# Patient Record
Sex: Female | Born: 1935 | Race: White | Hispanic: No | Marital: Married
Health system: Midwestern US, Academic
[De-identification: ages and names within clinical notes are randomized; demographics above are authoritative.]

---

## 2015-12-14 IMAGING — CR CHEST
2 series · 2 of 2 positions shown · non-contrast
Comparison: none

[chest pa]
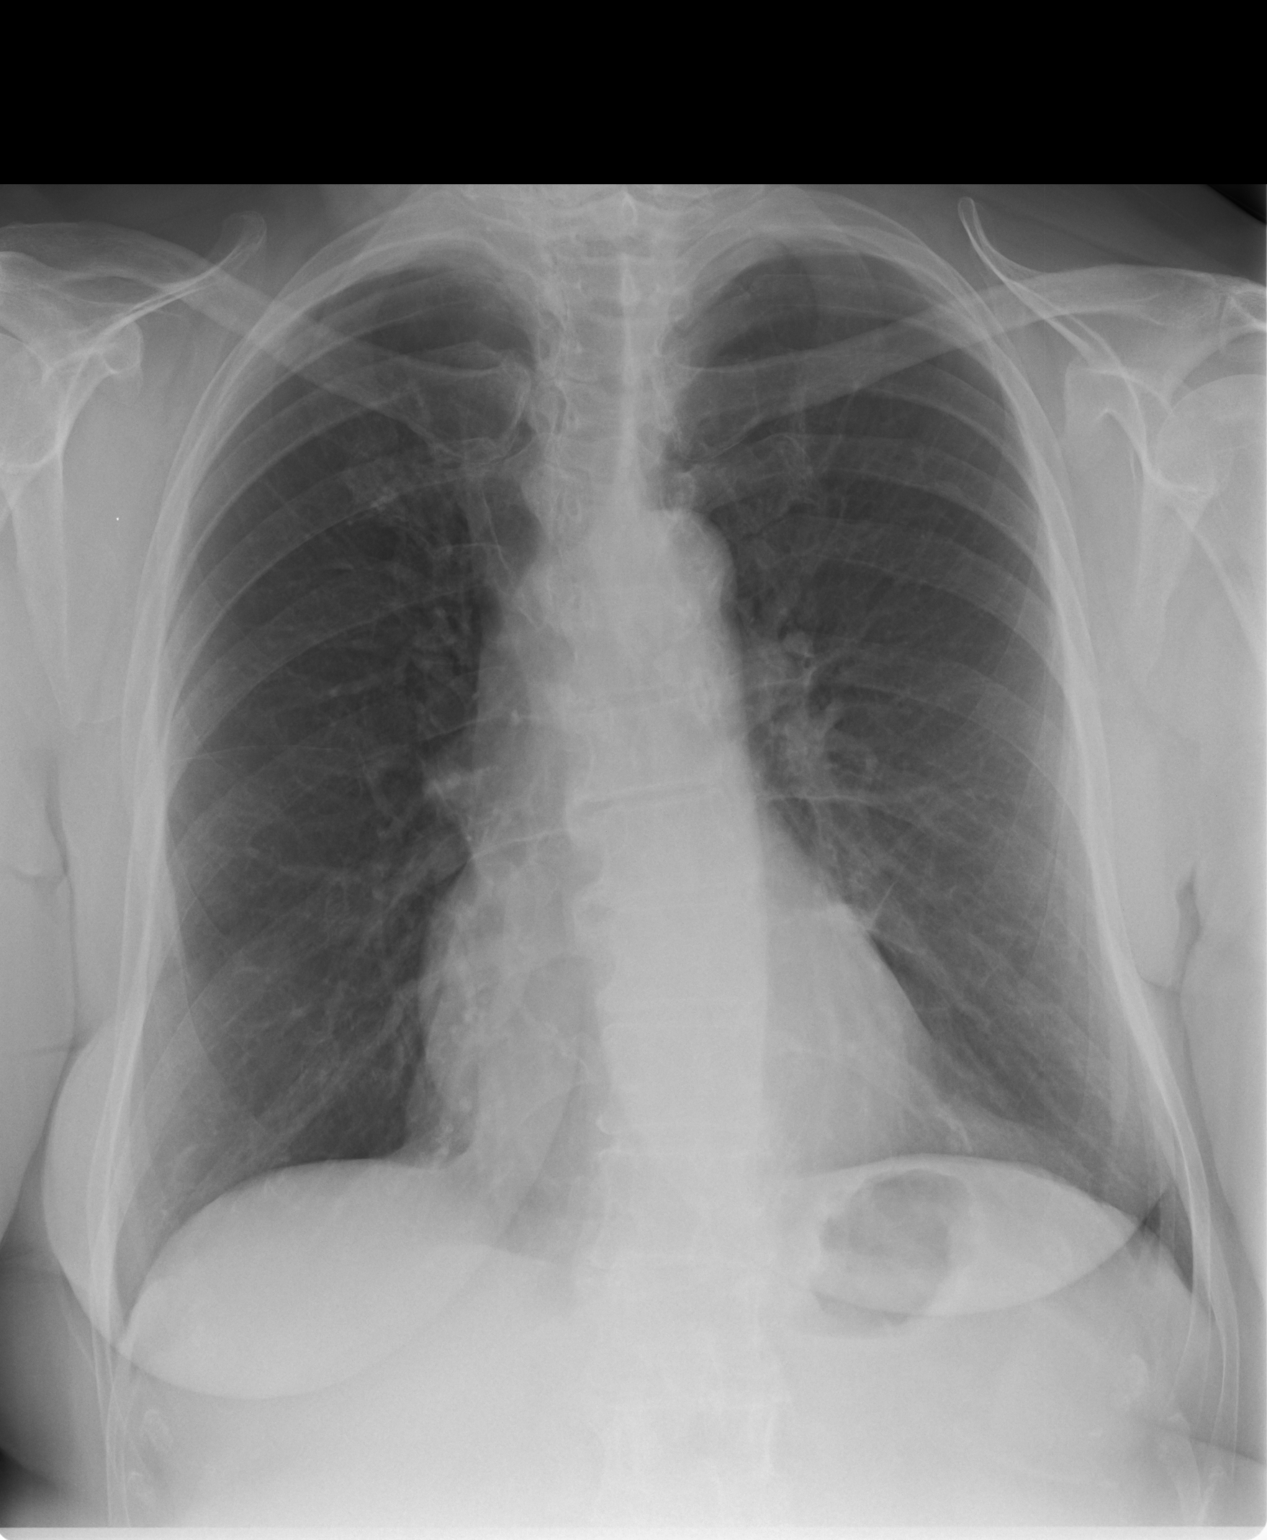

[chest lat]
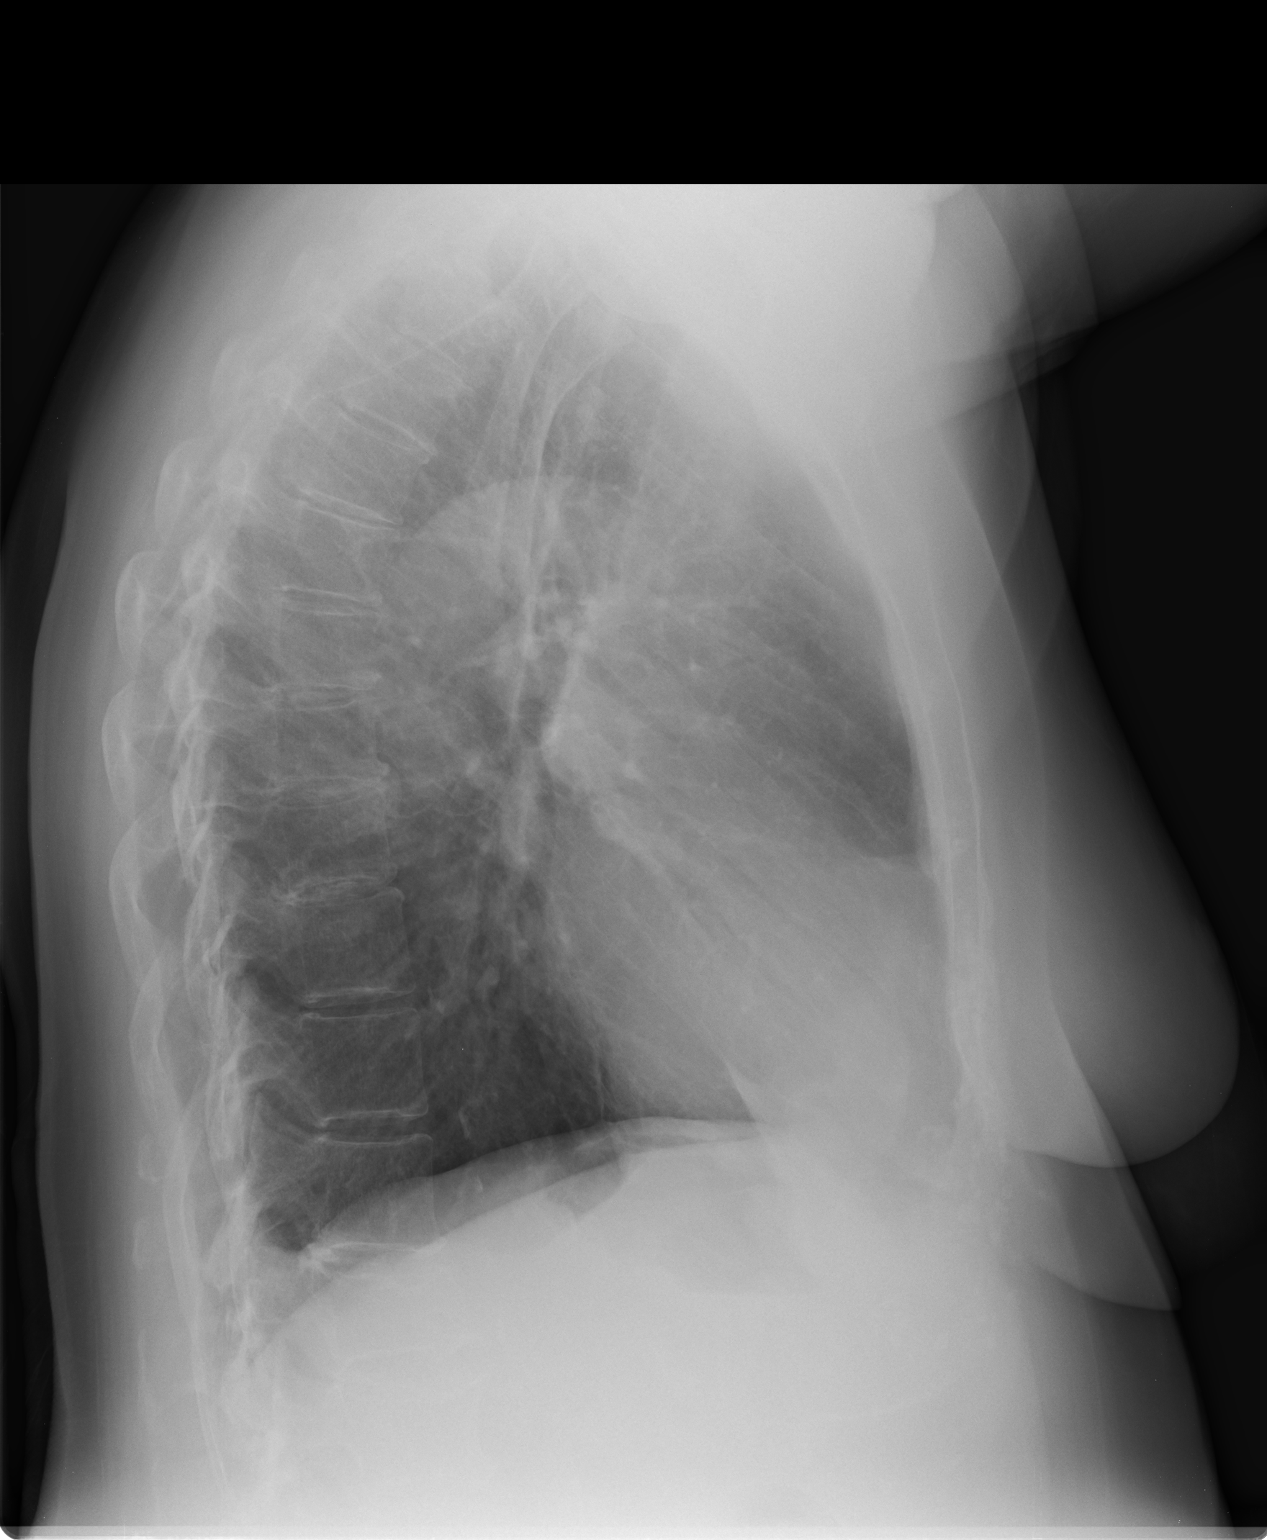

[2 of 2 positions shown; findings below may reference images not displayed]

DIAGNOSTIC STUDIES

EXAM
RADIOLOGICAL EXAMINATION, CHEST; 2 VIEWS FRONTAL AND LATERAL CPT 88686

INDICATION
SOA
SOA. HIP REPLACED 1 WEEK AGO. AB

TECHNIQUE
2 views of the chest were acquired.

COMPARISON
July 30, 2009 chest radiographs

FINDINGS
The cardiac silhouette is within normal limits for size. The mediastinum is not widened or
deviated. The lungs are clear and the costophrenic sulci are sharp. Mild multilevel degenerative
changes in the thoracic spine are present.

IMPRESSION
No acute cardiopulmonary process.

## 2017-09-12 ENCOUNTER — Encounter: Admit: 2017-09-12 | Discharge: 2017-09-12 | Payer: MEDICARE

## 2017-09-12 MED ORDER — ROSUVASTATIN 20 MG PO TAB
20 mg | ORAL_TABLET | Freq: Every day | ORAL | 0 refills | Status: SS
Start: 2017-09-12 — End: 2018-10-14

## 2018-04-02 ENCOUNTER — Ambulatory Visit: Admit: 2018-04-02 | Discharge: 2018-04-03 | Payer: MEDICARE

## 2018-04-03 DIAGNOSIS — I10 Essential (primary) hypertension: Principal | ICD-10-CM

## 2018-04-03 DIAGNOSIS — E782 Mixed hyperlipidemia: ICD-10-CM

## 2018-10-09 IMAGING — CR CHEST
3 series · 3 of 3 positions shown · non-contrast
Comparison: none

[rib upper]
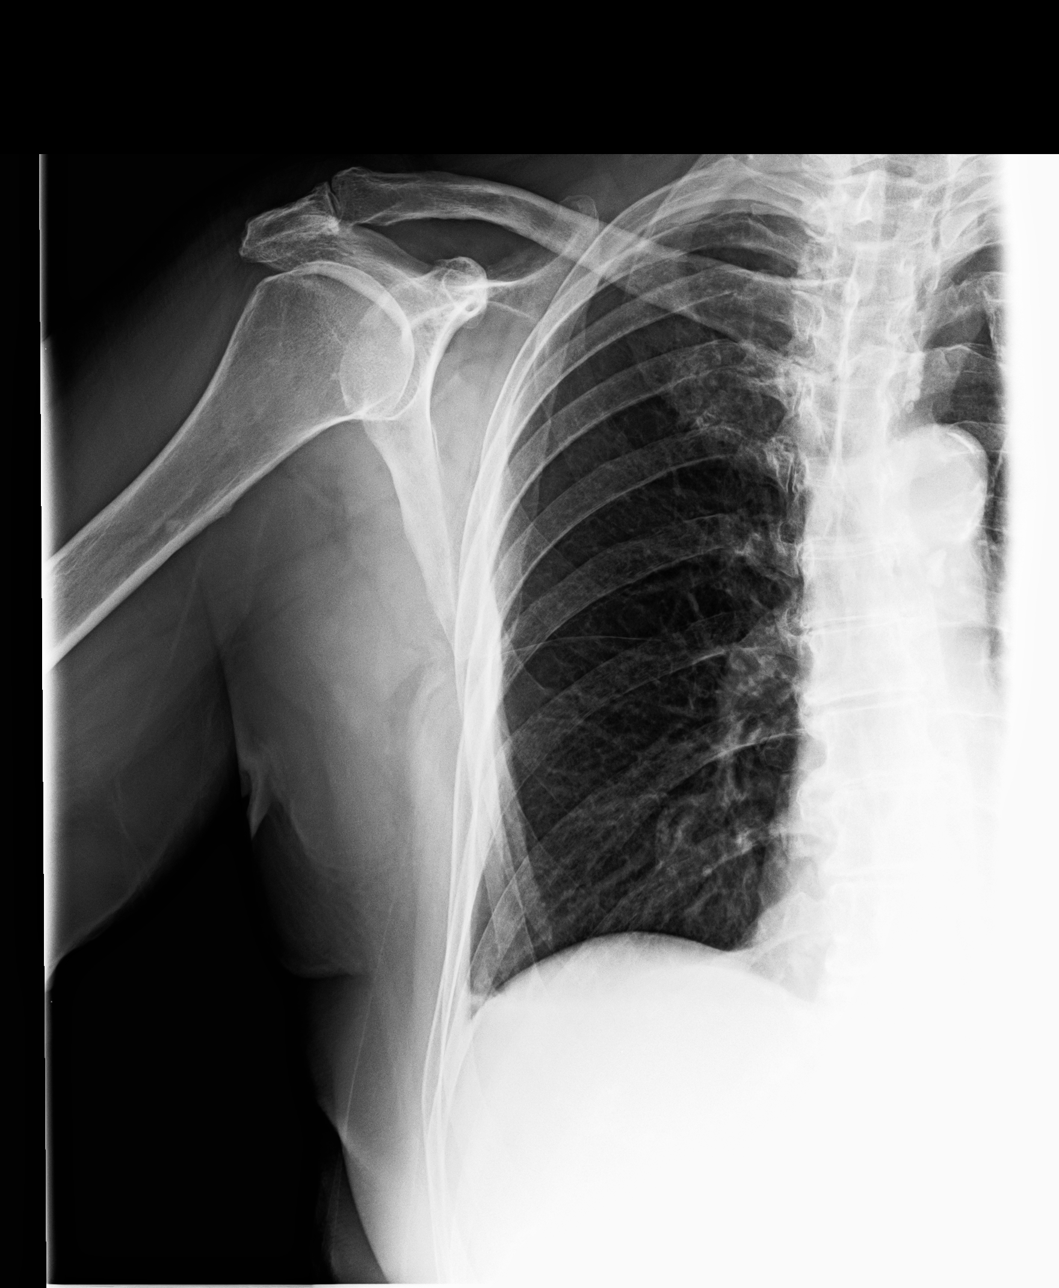

[rib upper obl]
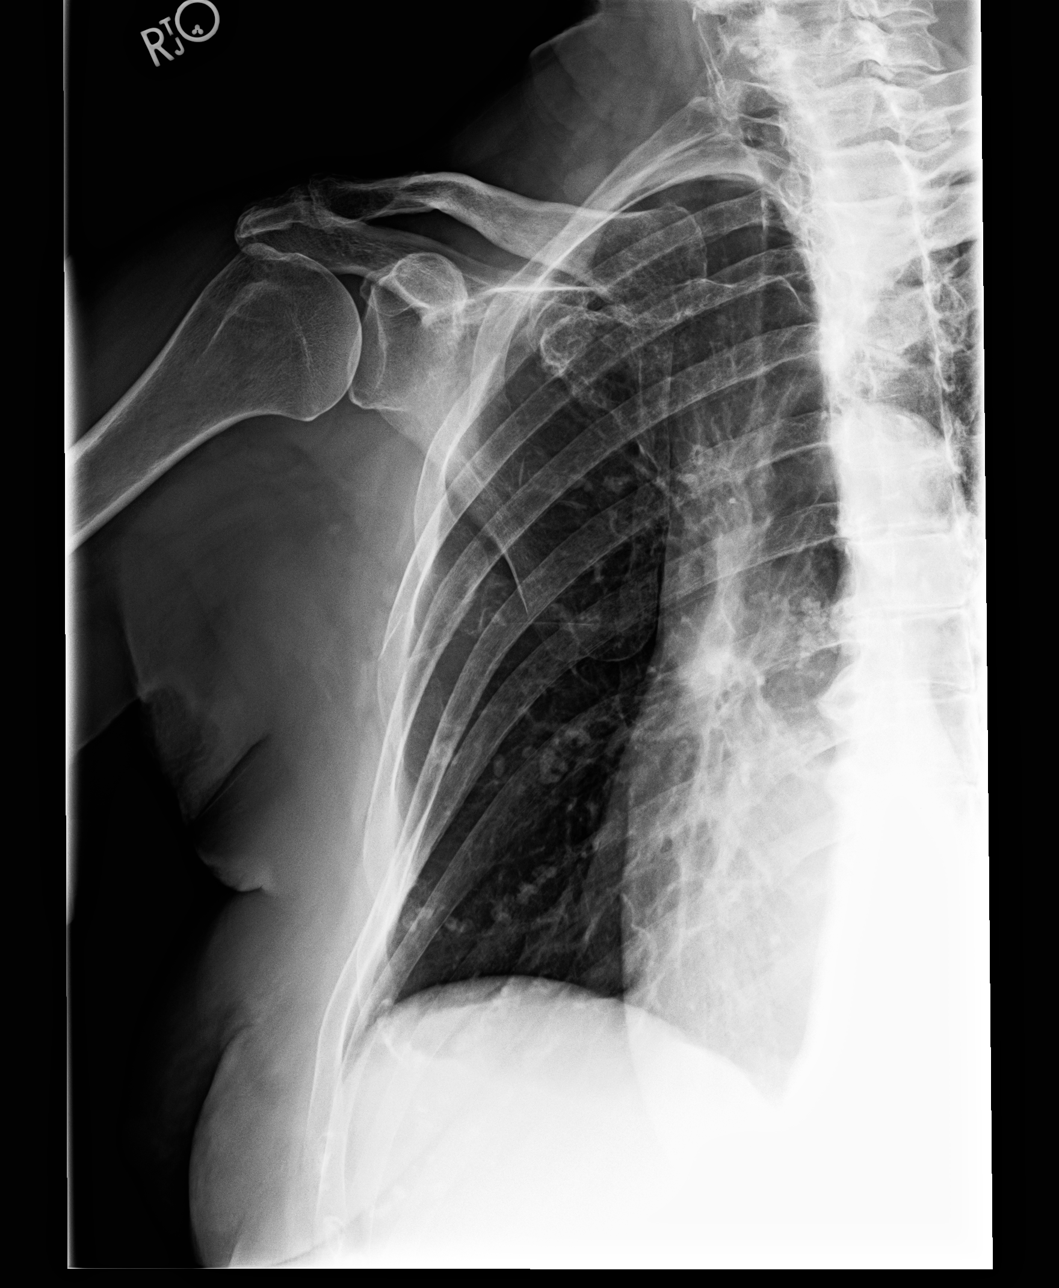

[rib lower]
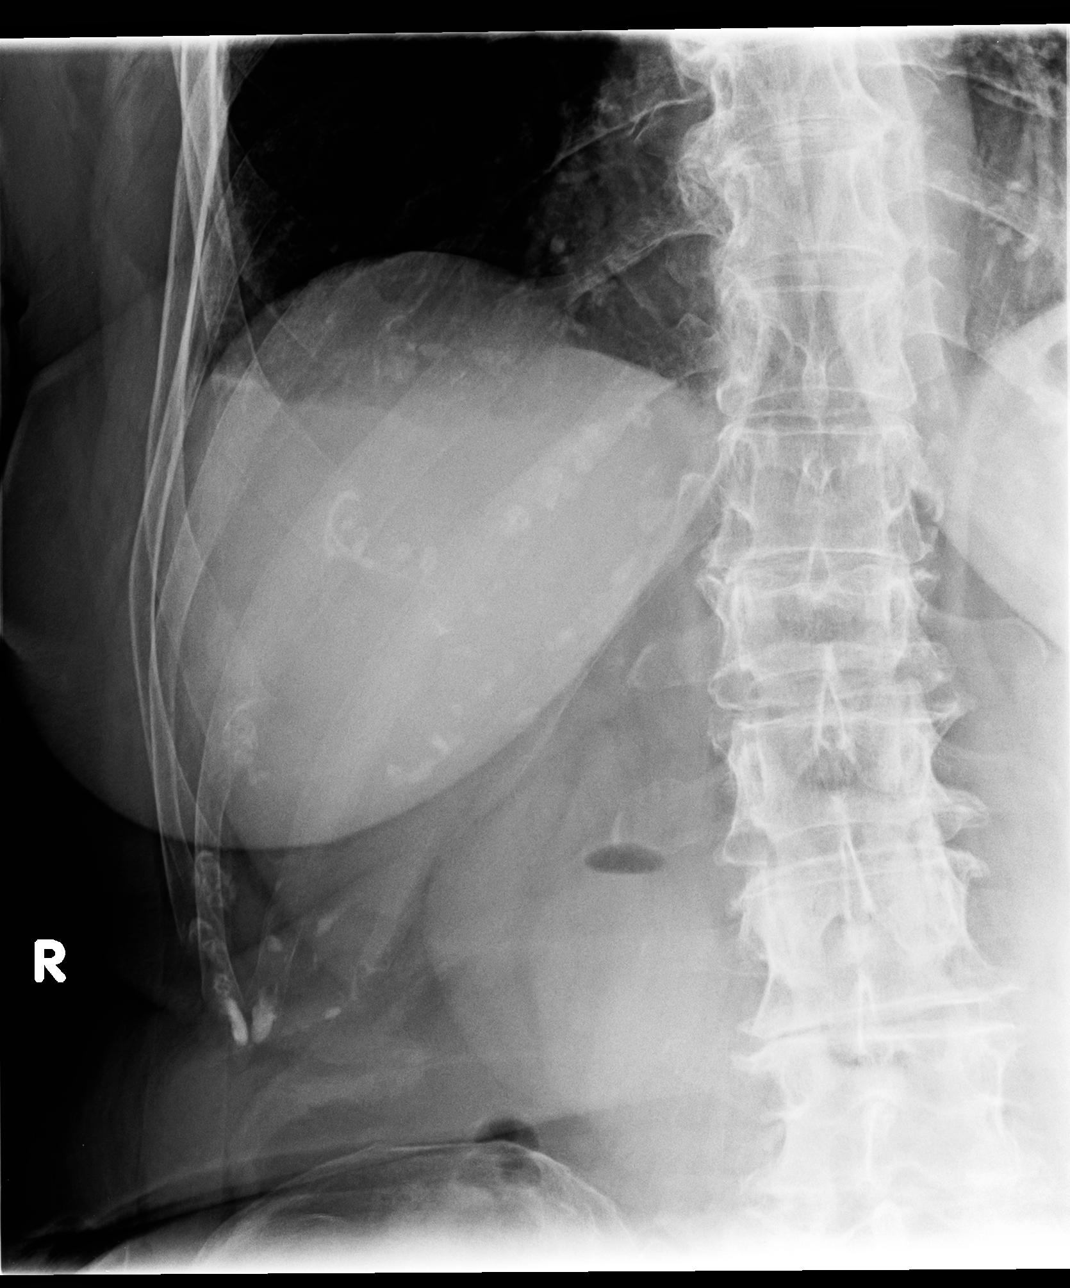

[3 of 3 positions shown; findings below may reference images not displayed]

DIAGNOSTIC STUDIES

EXAM
Right rib series.

INDICATION
right rib pain after fall
PT C/O FALL ONTO RIGHT POSTERIOR SHOULDER WHILE CAMPING. PAIN IN RIGHT LATERAL RIB REGION. TJ/CK

TECHNIQUE
Three views of the right hemithorax.

COMPARISONS
None.

FINDINGS
There is mild to moderate acromioclavicular joint degeneration. Bone demineralization. Evidence of
old granulomatous infection. Aortic atherosclerosis. No evidence of pneumothorax or layering right
pleural fluid. Probable right basilar atelectasis or scarring. No displaced fracture.

IMPRESSION
No displaced fracture.

Tech Notes:

PT C/O FALL ONTO RIGHT POSTERIOR SHOULDER WHILE CAMPING. PAIN IN RIGHT LATERAL RIB REGION. TJ/CK

## 2018-10-09 IMAGING — CR CHEST
3 series · 3 of 3 positions shown · non-contrast
Comparison: none

[shoulder external]
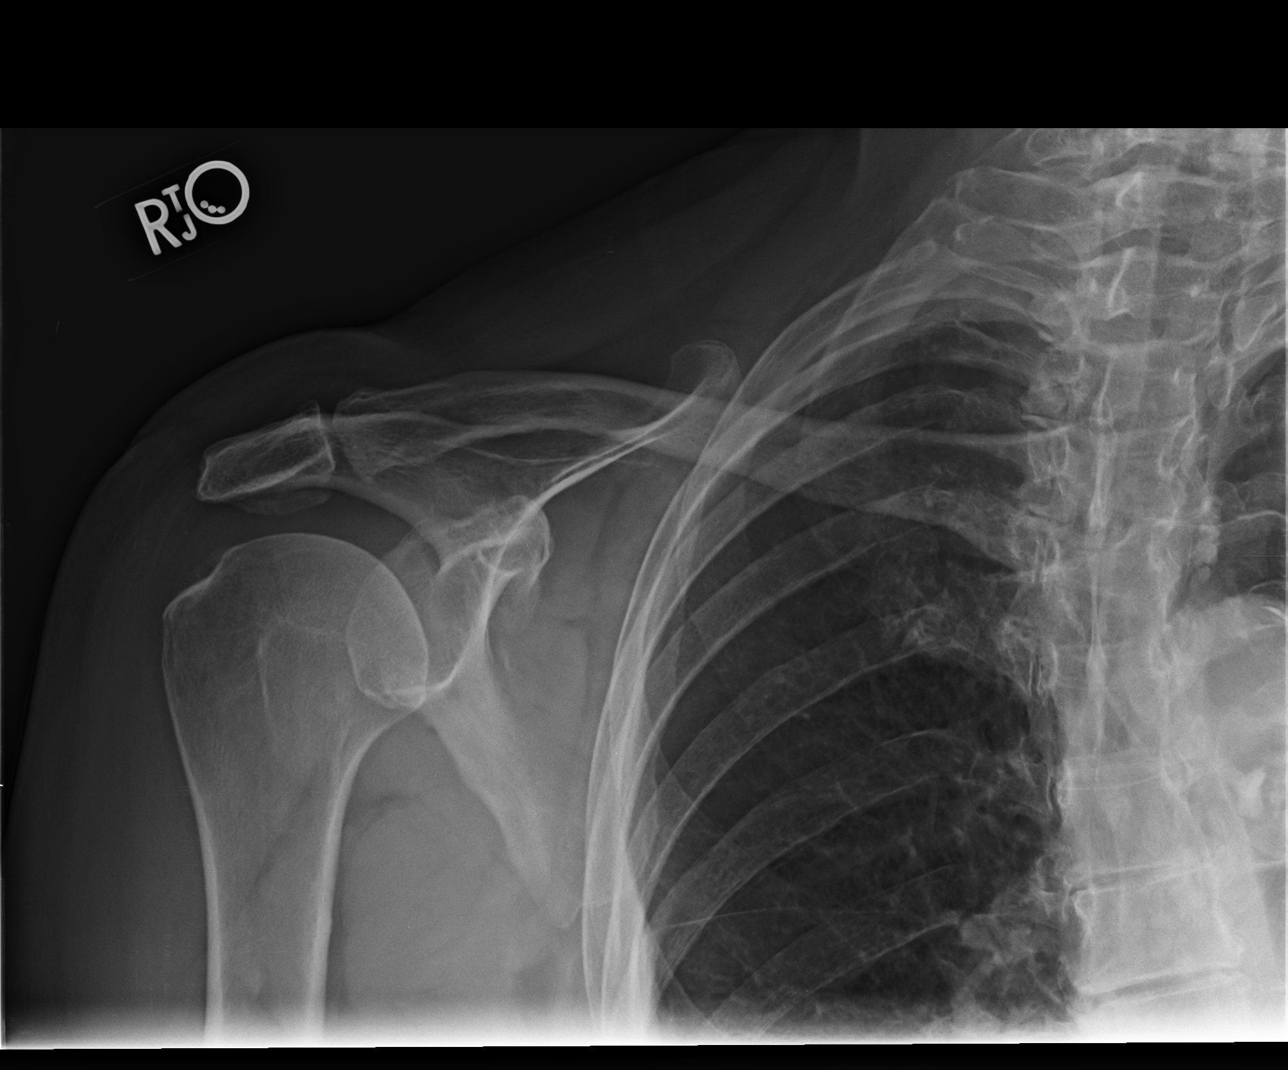

[shoulder internal]
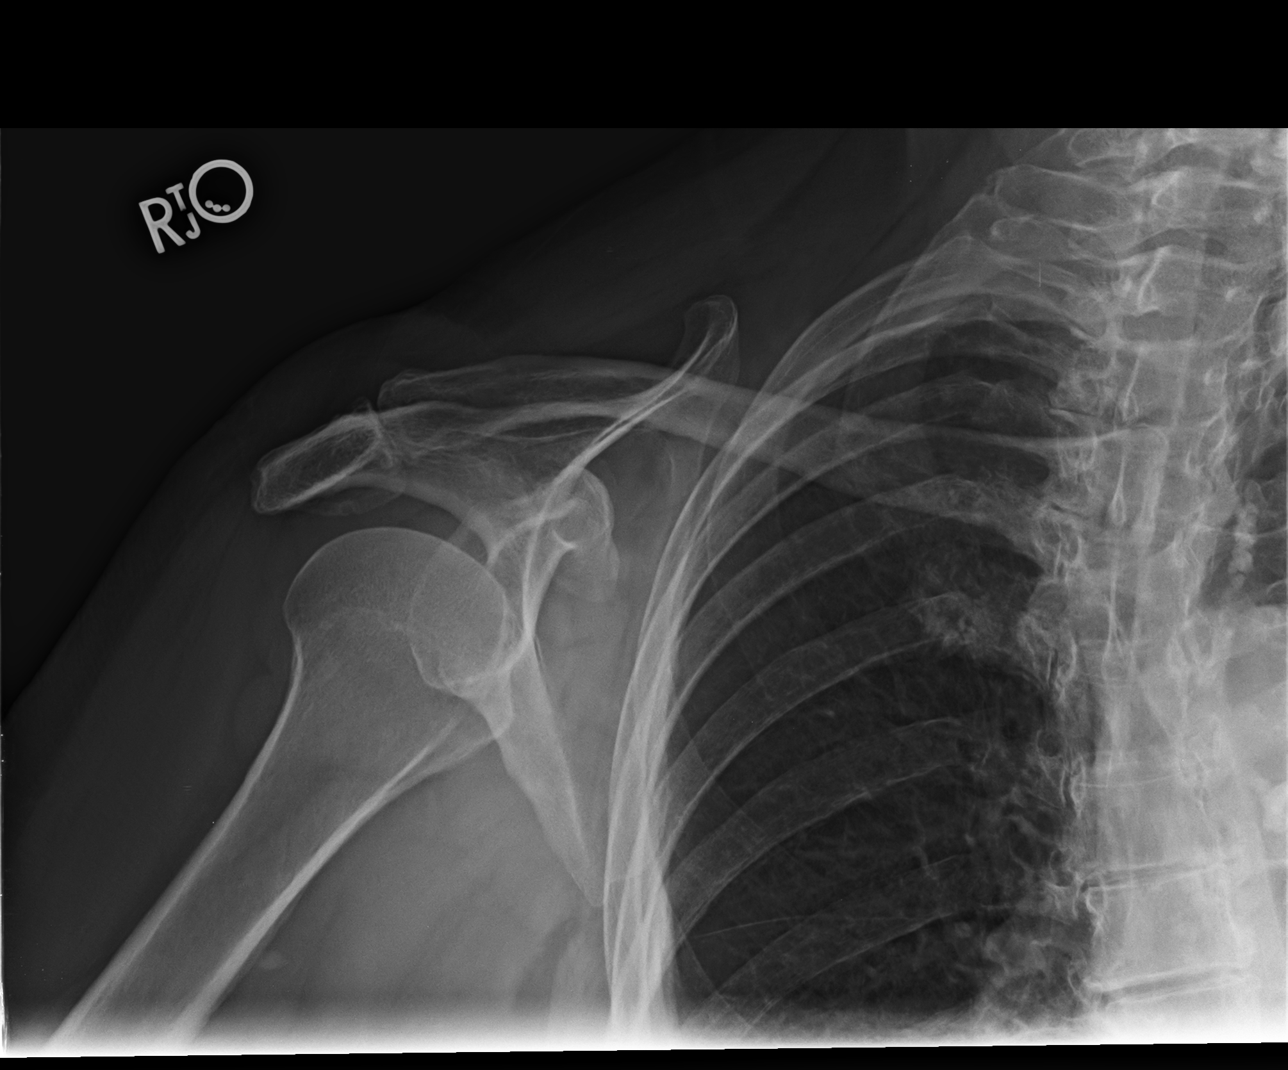

[shoulder y-view]
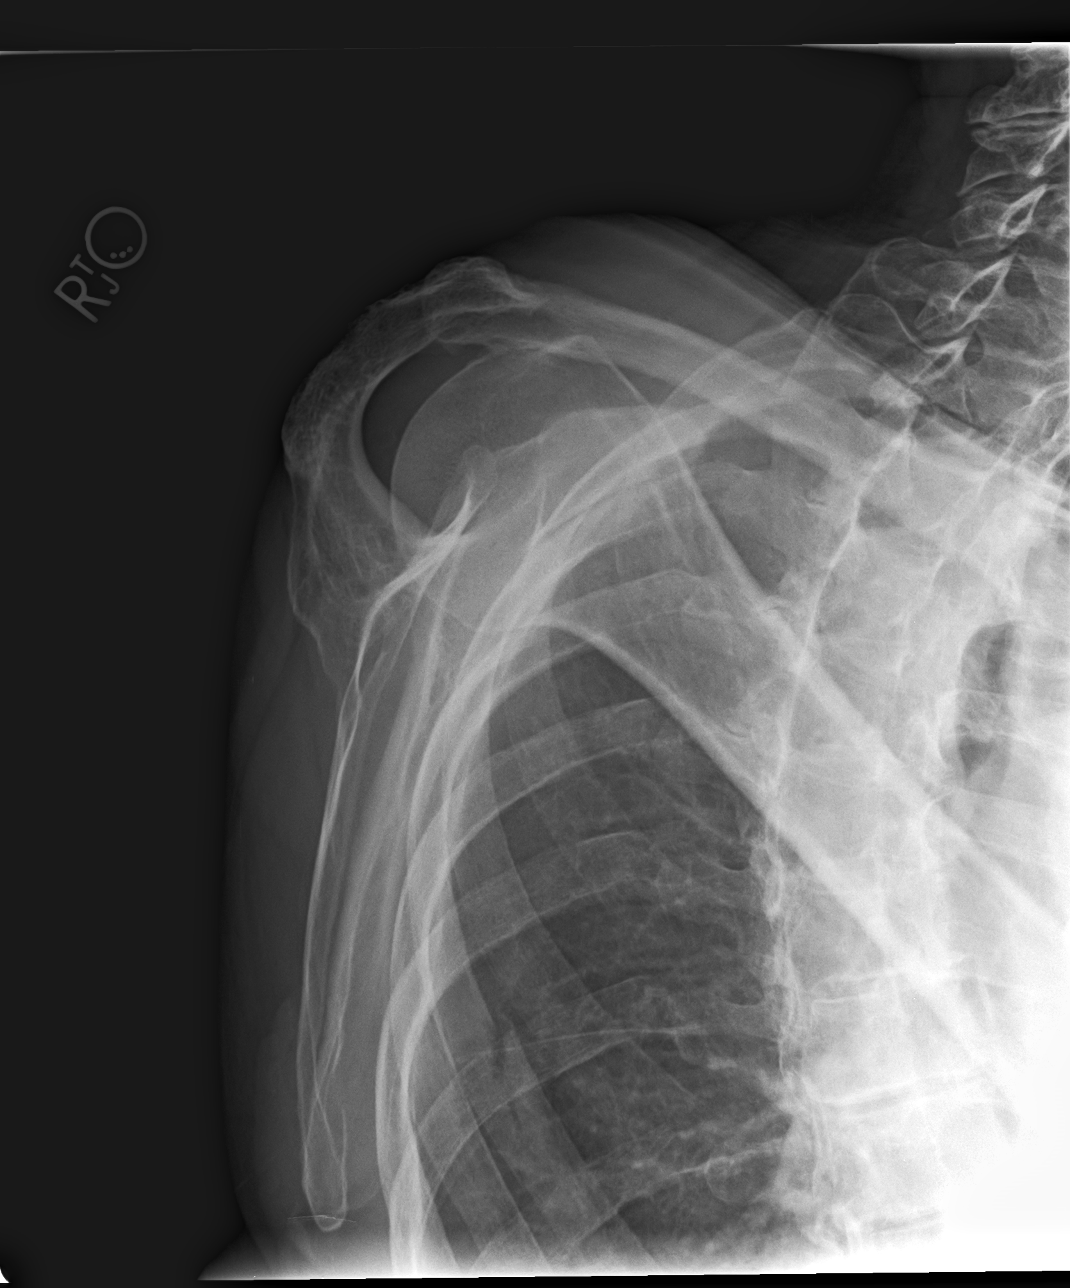

[3 of 3 positions shown; findings below may reference images not displayed]

DIAGNOSTIC STUDIES

EXAM
Right shoulder radiographs.

INDICATION
shoulder injury after fall
PT C/O FALL ONTO RIGHT POSTERIOR SHOULDER WHILE CAMPING. PAIN IN RIGHT LATERAL RIB REGION. TJ/CK

TECHNIQUE
Three views of the right shoulder.

COMPARISONS
None.

FINDINGS
Mild to moderate acromioclavicular osteoarthropathy with undersurface spurring suggested. There is
calcification at the greater tuberosity which can be seen with calcific tendinitis or possible f
ragmented enthesopathy. No acutely displaced shoulder fracture. However, there is a fracture of the
right lateral 5th rib. No aggressive osseous lesion.

IMPRESSION
No shoulder fracture. However, there is a nondisplaced fracture of the right lateral 5th rib.

Tech Notes:

PT C/O FALL ONTO RIGHT POSTERIOR SHOULDER WHILE CAMPING. PAIN IN RIGHT LATERAL RIB REGION. TJ/CK

## 2018-10-12 ENCOUNTER — Encounter: Admit: 2018-10-12 | Discharge: 2018-10-12 | Payer: MEDICARE

## 2018-10-12 ENCOUNTER — Inpatient Hospital Stay: Admit: 2018-10-13 | Discharge: 2018-10-13 | Payer: MEDICARE

## 2018-10-12 DIAGNOSIS — I499 Cardiac arrhythmia, unspecified: ICD-10-CM

## 2018-10-12 DIAGNOSIS — K921 Melena: Principal | ICD-10-CM

## 2018-10-12 DIAGNOSIS — M1990 Unspecified osteoarthritis, unspecified site: ICD-10-CM

## 2018-10-12 DIAGNOSIS — R002 Palpitations: ICD-10-CM

## 2018-10-12 DIAGNOSIS — E785 Hyperlipidemia, unspecified: ICD-10-CM

## 2018-10-12 DIAGNOSIS — M8580 Other specified disorders of bone density and structure, unspecified site: ICD-10-CM

## 2018-10-12 DIAGNOSIS — I10 Essential (primary) hypertension: Principal | ICD-10-CM

## 2018-10-12 DIAGNOSIS — E669 Obesity, unspecified: ICD-10-CM

## 2018-10-12 LAB — URINALYSIS, MICROSCOPIC

## 2018-10-12 LAB — URINALYSIS DIPSTICK
Lab: 6 mg/dL (ref 5.0–8.0)
Lab: NEGATIVE 10*3/uL (ref 0–0.45)
Lab: NEGATIVE MMOL/L (ref 21–30)
Lab: NEGATIVE U/L (ref 7–56)
Lab: NEGATIVE mL/min (ref 0–0.80)
Lab: NEGATIVE mL/min (ref 1.0–4.8)

## 2018-10-12 LAB — LACTIC ACID (BG - RAPID LACTATE): Lab: 1.7 MMOL/L (ref 0.5–2.0)

## 2018-10-12 LAB — CBC AND DIFF
Lab: 0 10*3/uL (ref 0–0.20)
Lab: 8.5 10*3/uL (ref 4.5–11.0)

## 2018-10-12 LAB — COMPREHENSIVE METABOLIC PANEL
Lab: 104 MMOL/L (ref 98–110)
Lab: 113 mg/dL — ABNORMAL HIGH (ref 70–100)
Lab: 138 MMOL/L (ref 137–147)
Lab: 21 mg/dL (ref 7–25)
Lab: 39 U/L (ref 7–40)
Lab: 43 U/L — ABNORMAL LOW (ref 25–110)

## 2018-10-12 LAB — LIPASE: Lab: 40 U/L — ABNORMAL HIGH (ref 11–82)

## 2018-10-12 MED ORDER — BRIMONIDINE 0.2 % OP DROP
1 [drp] | OPHTHALMIC | 0 refills | Status: DC
Start: 2018-10-12 — End: 2018-10-15
  Administered 2018-10-12: 22:00:00 1 [drp] via OPHTHALMIC

## 2018-10-12 MED ORDER — LATANOPROST 0.005 % OP DROP
1 [drp] | Freq: Every evening | OPHTHALMIC | 0 refills | Status: DC
Start: 2018-10-12 — End: 2018-10-15
  Administered 2018-10-13 – 2018-10-14 (×2): 1 [drp] via OPHTHALMIC

## 2018-10-12 MED ORDER — TIMOLOL MALEATE 0.5 % OP DROP
1 [drp] | Freq: Two times a day (BID) | OPHTHALMIC | 0 refills | Status: DC
Start: 2018-10-12 — End: 2018-10-15
  Administered 2018-10-13: 02:00:00 1 [drp] via OPHTHALMIC

## 2018-10-12 MED ORDER — PANTOPRAZOLE 40 MG IV SOLR
80 mg | Freq: Once | INTRAVENOUS | 0 refills | Status: CP
Start: 2018-10-12 — End: ?
  Administered 2018-10-12: 18:00:00 80 mg via INTRAVENOUS

## 2018-10-12 MED ORDER — DORZOLAMIDE 2 % OP DROP
1 [drp] | Freq: Two times a day (BID) | OPHTHALMIC | 0 refills | Status: DC
Start: 2018-10-12 — End: 2018-10-15
  Administered 2018-10-13: 02:00:00 1 [drp] via OPHTHALMIC

## 2018-10-12 MED ORDER — PANTOPRAZOLE 40 MG IV SOLR
40 mg | Freq: Two times a day (BID) | INTRAVENOUS | 0 refills | Status: DC
Start: 2018-10-12 — End: 2018-10-15
  Administered 2018-10-13 – 2018-10-15 (×6): 40 mg via INTRAVENOUS

## 2018-10-12 MED ORDER — LACTATED RINGERS IV SOLP
INTRAVENOUS | 0 refills | Status: CN
Start: 2018-10-12 — End: ?

## 2018-10-13 ENCOUNTER — Inpatient Hospital Stay: Admit: 2018-10-13 | Discharge: 2018-10-13 | Payer: MEDICARE

## 2018-10-13 LAB — HEMOGLOBIN & HEMATOCRIT
Lab: 12 g/dL (ref 12.0–15.0)
Lab: 37 % (ref 36–45)
Lab: 12 g/dL — ABNORMAL LOW (ref 12.0–15.0)

## 2018-10-13 LAB — CBC
Lab: 9.2 K/UL — ABNORMAL LOW (ref >40)
Lab: 13 g/dL (ref 12.0–15.0)
Lab: 14 % (ref 11–15)
Lab: 230 10*3/uL (ref 150–400)
Lab: 31 pg (ref 26–34)
Lab: 33 g/dL (ref 32.0–36.0)
Lab: 39 % (ref 36–45)
Lab: 4.2 M/UL (ref 4.0–5.0)
Lab: 7.5 10*3/uL (ref 4.5–11.0)
Lab: 8.5 FL (ref 7–11)
Lab: 93 FL (ref 80–100)

## 2018-10-13 LAB — COMPREHENSIVE METABOLIC PANEL: Lab: 139 MMOL/L — ABNORMAL HIGH (ref >40)

## 2018-10-13 MED ORDER — LACTATED RINGERS IV SOLP
1000 mL | INTRAVENOUS | 0 refills | Status: CN
Start: 2018-10-13 — End: ?

## 2018-10-14 LAB — COMPREHENSIVE METABOLIC PANEL
Lab: 140 MMOL/L — ABNORMAL HIGH (ref 137–147)
Lab: 3.5 MMOL/L — ABNORMAL HIGH (ref >60)

## 2018-10-14 LAB — CBC: Lab: 7 K/UL — ABNORMAL LOW (ref >60)

## 2018-10-14 MED ORDER — SODIUM CHLORIDE 0.9 % IV SOLP
INTRAVENOUS | 0 refills | Status: CN
Start: 2018-10-14 — End: ?

## 2018-10-15 ENCOUNTER — Encounter: Admit: 2018-10-15 | Discharge: 2018-10-15 | Payer: MEDICARE

## 2018-10-15 ENCOUNTER — Inpatient Hospital Stay: Admit: 2018-10-12 | Discharge: 2018-10-15 | Disposition: A | Discharge status: Home Health | Payer: MEDICARE

## 2018-10-15 DIAGNOSIS — M8580 Other specified disorders of bone density and structure, unspecified site: ICD-10-CM

## 2018-10-15 DIAGNOSIS — I499 Cardiac arrhythmia, unspecified: ICD-10-CM

## 2018-10-15 DIAGNOSIS — K250 Acute gastric ulcer with hemorrhage: Principal | ICD-10-CM

## 2018-10-15 DIAGNOSIS — Z9049 Acquired absence of other specified parts of digestive tract: ICD-10-CM

## 2018-10-15 DIAGNOSIS — K921 Melena: Principal | ICD-10-CM

## 2018-10-15 DIAGNOSIS — Z7982 Long term (current) use of aspirin: ICD-10-CM

## 2018-10-15 DIAGNOSIS — K228 Other specified diseases of esophagus: ICD-10-CM

## 2018-10-15 DIAGNOSIS — I10 Essential (primary) hypertension: ICD-10-CM

## 2018-10-15 DIAGNOSIS — Z800 Family history of malignant neoplasm of digestive organs: ICD-10-CM

## 2018-10-15 DIAGNOSIS — K254 Chronic or unspecified gastric ulcer with hemorrhage: ICD-10-CM

## 2018-10-15 DIAGNOSIS — E785 Hyperlipidemia, unspecified: ICD-10-CM

## 2018-10-15 DIAGNOSIS — K3189 Other diseases of stomach and duodenum: ICD-10-CM

## 2018-10-15 DIAGNOSIS — R002 Palpitations: ICD-10-CM

## 2018-10-15 DIAGNOSIS — Z86010 Personal history of colonic polyps: ICD-10-CM

## 2018-10-15 DIAGNOSIS — S2239XD Fracture of one rib, unspecified side, subsequent encounter for fracture with routine healing: ICD-10-CM

## 2018-10-15 DIAGNOSIS — Z79899 Other long term (current) drug therapy: ICD-10-CM

## 2018-10-15 DIAGNOSIS — E669 Obesity, unspecified: ICD-10-CM

## 2018-10-15 DIAGNOSIS — M1990 Unspecified osteoarthritis, unspecified site: ICD-10-CM

## 2018-10-15 LAB — CBC
Lab: 4.4 M/UL (ref >60)
Lab: 6.7 K/UL — ABNORMAL HIGH (ref 4.5–11.0)

## 2018-10-15 MED ORDER — LACTATED RINGERS IV SOLP
INTRAVENOUS | 0 refills | Status: CN
Start: 2018-10-15 — End: ?

## 2018-10-15 MED ORDER — PROPOFOL 10 MG/ML IV EMUL 20 ML (INFUSION)(AM)(OR)
INTRAVENOUS | 0 refills | Status: DC
Start: 2018-10-15 — End: 2018-10-15
  Administered 2018-10-15: 13:00:00 120 ug/kg/min via INTRAVENOUS

## 2018-10-15 MED ORDER — PANTOPRAZOLE 20 MG PO TBEC
40 mg | ORAL_TABLET | Freq: Two times a day (BID) | ORAL | 3 refills | 90.00000 days | Status: AC
Start: 2018-10-15 — End: 2019-05-16
  Filled 2018-10-15 (×2): qty 360, 7d supply, fill #1
  Filled 2018-10-16 (×2): qty 360, 83d supply, fill #2

## 2018-10-15 MED ORDER — LACTATED RINGERS IV SOLP
1000 mL | Freq: Once | INTRAVENOUS | 0 refills | Status: CP
Start: 2018-10-15 — End: ?
  Administered 2018-10-15: 12:00:00 1000 mL via INTRAVENOUS

## 2018-10-15 MED ORDER — LACTATED RINGERS IV SOLP
0 refills | Status: DC
Start: 2018-10-15 — End: 2018-10-15
  Administered 2018-10-15: 13:00:00 via INTRAVENOUS

## 2018-10-15 MED ORDER — PROPOFOL INJ 10 MG/ML IV VIAL
0 refills | Status: DC
Start: 2018-10-15 — End: 2018-10-15
  Administered 2018-10-15: 13:00:00 40 mg via INTRAVENOUS
  Administered 2018-10-15: 13:00:00 20 mg via INTRAVENOUS

## 2018-10-15 MED ORDER — LIDOCAINE (PF) 200 MG/10 ML (2 %) IJ SYRG
0 refills | Status: DC
Start: 2018-10-15 — End: 2018-10-15
  Administered 2018-10-15: 13:00:00 80 mg via INTRAVENOUS

## 2018-10-16 ENCOUNTER — Encounter: Admit: 2018-10-16 | Discharge: 2018-10-16 | Payer: MEDICARE

## 2018-10-16 DIAGNOSIS — I10 Essential (primary) hypertension: Principal | ICD-10-CM

## 2018-10-16 DIAGNOSIS — E669 Obesity, unspecified: ICD-10-CM

## 2018-10-16 DIAGNOSIS — M1990 Unspecified osteoarthritis, unspecified site: ICD-10-CM

## 2018-10-16 DIAGNOSIS — I499 Cardiac arrhythmia, unspecified: ICD-10-CM

## 2018-10-16 DIAGNOSIS — E785 Hyperlipidemia, unspecified: ICD-10-CM

## 2018-10-16 DIAGNOSIS — M8580 Other specified disorders of bone density and structure, unspecified site: ICD-10-CM

## 2018-10-16 DIAGNOSIS — R002 Palpitations: ICD-10-CM

## 2018-11-15 ENCOUNTER — Encounter: Admit: 2018-11-15 | Discharge: 2018-11-15 | Payer: MEDICARE

## 2019-01-16 ENCOUNTER — Ambulatory Visit: Admit: 2019-01-16 | Discharge: 2019-01-16 | Payer: MEDICARE

## 2019-01-16 ENCOUNTER — Encounter: Admit: 2019-01-16 | Discharge: 2019-01-16 | Payer: MEDICARE

## 2019-01-16 DIAGNOSIS — E785 Hyperlipidemia, unspecified: Secondary | ICD-10-CM

## 2019-01-16 DIAGNOSIS — M8580 Other specified disorders of bone density and structure, unspecified site: Secondary | ICD-10-CM

## 2019-01-16 DIAGNOSIS — I10 Essential (primary) hypertension: Secondary | ICD-10-CM

## 2019-01-16 DIAGNOSIS — M1990 Unspecified osteoarthritis, unspecified site: Secondary | ICD-10-CM

## 2019-01-16 DIAGNOSIS — K250 Acute gastric ulcer with hemorrhage: Principal | ICD-10-CM

## 2019-01-16 DIAGNOSIS — E669 Obesity, unspecified: Secondary | ICD-10-CM

## 2019-01-16 MED ORDER — LACTATED RINGERS IV SOLP
INTRAVENOUS | 0 refills | Status: DC
Start: 2019-01-16 — End: 2019-01-16
  Administered 2019-01-16: 17:00:00 1000.000 mL via INTRAVENOUS
  Administered 2019-01-16: 17:00:00 1000 mL via INTRAVENOUS

## 2019-01-16 MED ORDER — INSULIN ASPART 100 UNIT/ML SC FLEXPEN
6 [IU] | Freq: Once | SUBCUTANEOUS | 0 refills | Status: DC
Start: 2019-01-16 — End: 2019-01-16

## 2019-01-16 MED ORDER — LIDOCAINE (PF) 200 MG/10 ML (2 %) IJ SYRG
0 refills | Status: DC
Start: 2019-01-16 — End: 2019-01-16
  Administered 2019-01-16: 17:00:00 100 mg via INTRAVENOUS

## 2019-01-16 MED ORDER — PROPOFOL 10 MG/ML IV EMUL 50 ML (INFUSION)(AM)(OR)
INTRAVENOUS | 0 refills | Status: DC
Start: 2019-01-16 — End: 2019-01-16
  Administered 2019-01-16: 17:00:00 125 ug/kg/min via INTRAVENOUS

## 2019-01-17 ENCOUNTER — Encounter: Admit: 2019-01-17 | Discharge: 2019-01-17 | Payer: MEDICARE

## 2019-01-17 DIAGNOSIS — I499 Cardiac arrhythmia, unspecified: Secondary | ICD-10-CM

## 2019-01-17 DIAGNOSIS — E669 Obesity, unspecified: Secondary | ICD-10-CM

## 2019-01-17 DIAGNOSIS — R002 Palpitations: Secondary | ICD-10-CM

## 2019-01-17 DIAGNOSIS — I10 Essential (primary) hypertension: Secondary | ICD-10-CM

## 2019-01-17 DIAGNOSIS — M1990 Unspecified osteoarthritis, unspecified site: Secondary | ICD-10-CM

## 2019-01-17 DIAGNOSIS — M8580 Other specified disorders of bone density and structure, unspecified site: Secondary | ICD-10-CM

## 2019-01-17 DIAGNOSIS — E785 Hyperlipidemia, unspecified: Secondary | ICD-10-CM

## 2019-01-21 ENCOUNTER — Encounter: Admit: 2019-01-21 | Discharge: 2019-01-21 | Payer: MEDICARE

## 2019-05-16 ENCOUNTER — Ambulatory Visit: Admit: 2019-05-16 | Discharge: 2019-05-16 | Payer: MEDICARE

## 2019-05-16 ENCOUNTER — Encounter: Admit: 2019-05-16 | Discharge: 2019-05-16 | Payer: MEDICARE

## 2019-05-16 DIAGNOSIS — M1990 Unspecified osteoarthritis, unspecified site: ICD-10-CM

## 2019-05-16 DIAGNOSIS — E782 Mixed hyperlipidemia: ICD-10-CM

## 2019-05-16 DIAGNOSIS — E7849 Other hyperlipidemia: ICD-10-CM

## 2019-05-16 DIAGNOSIS — I499 Cardiac arrhythmia, unspecified: ICD-10-CM

## 2019-05-16 DIAGNOSIS — R002 Palpitations: Secondary | ICD-10-CM

## 2019-05-16 DIAGNOSIS — I10 Essential (primary) hypertension: Principal | ICD-10-CM

## 2019-05-16 DIAGNOSIS — I341 Nonrheumatic mitral (valve) prolapse: Secondary | ICD-10-CM

## 2019-05-16 DIAGNOSIS — E669 Obesity, unspecified: ICD-10-CM

## 2019-05-16 DIAGNOSIS — M8580 Other specified disorders of bone density and structure, unspecified site: ICD-10-CM

## 2019-05-16 DIAGNOSIS — E785 Hyperlipidemia, unspecified: ICD-10-CM

## 2019-05-16 NOTE — Progress Notes
Psychiatric/Behavioral: Negative.    Allergic/Immunologic: Negative.        Physical Exam  Physical Exam   General Appearance: alert and oriented, no acute distress  Skin: warm, moist, no ulcers  Head: normocephalic, symmetric  Eyes: EOMI, PERRL, sclera are clear and without icterus  ENT: unremarkable, nares patent  Neck Veins: neck veins are flat, neck veins are not distended  Carotid Arteries: normal carotid upstroke bilaterally, no bruits  Chest Inspection: chest is normal in appearance  Auscultation/Percussion: lungs clear to auscultation, no rales, rhonchi, wheezes or friction rub appreciated  Cardiac Rhythm: regular rhythm and normal rate  Cardiac Auscultation: Normal S1 & S2, no S3 or S4, no rub - normal pmi  Murmurs: no cardiac murmurs   Extremities: no lower extremity edema bilaterally; 2+ symmetric distal pulses  Muskuloskeletal: no obvious deformity  Neurologic Exam: neurological assessment grossly intact  Mood and Affect: Appropriate      Cardiovascular Studies  ECG - NSR    Problems Addressed Today  Encounter Diagnoses   Name Primary?   ??? Mixed hyperlipidemia Yes   ??? Essential hypertension    ??? Palpitations    ??? Mitral valve prolapse        Assessment and Plan     1. Hypertension-she is currently on losartan and hydrochlorothiazide.  Her blood pressures been well controlled and she is not having any orthostatic symptoms.  I am not can make any adjustments to her antihypertensive regimen.  2. Dyslipidemia-she is due for a repeat fasting lipid profile.  She is currently off a statin due to the elevations in her liver enzymes.  She is can be following back with Dr. like to get her blood work done.    We will plan to see her back on an annual basis or sooner if needed.         Current Medications (including today's revisions)  ??? brimonidine (ALPHAGAN P) 0.15 % ophthalmic solution Apply 1 drop to both eyes three times daily.   ??? calcium carbonate/vitamin D3 (CALCIUM 600 + D PO) Take 2 tablets by

## 2019-05-27 LAB — HEMOGLOBIN A1C: Lab: 5.2

## 2019-05-27 LAB — COMPREHENSIVE METABOLIC PANEL
Lab: 0.4
Lab: 0.8
Lab: 108
Lab: 140
Lab: 17
Lab: 19
Lab: 21
Lab: 24
Lab: 4
Lab: 4.1
Lab: 48
Lab: 7.4
Lab: 95
Lab: >60
Lab: >60

## 2019-05-29 ENCOUNTER — Encounter: Admit: 2019-05-29 | Discharge: 2019-05-29

## 2019-05-29 DIAGNOSIS — I341 Nonrheumatic mitral (valve) prolapse: Secondary | ICD-10-CM

## 2019-05-29 DIAGNOSIS — E782 Mixed hyperlipidemia: Secondary | ICD-10-CM

## 2019-05-29 DIAGNOSIS — I10 Essential (primary) hypertension: Secondary | ICD-10-CM

## 2019-05-29 DIAGNOSIS — R002 Palpitations: Secondary | ICD-10-CM

## 2019-06-13 ENCOUNTER — Encounter: Admit: 2019-06-13 | Discharge: 2019-06-13

## 2020-06-25 ENCOUNTER — Ambulatory Visit: Admit: 2020-06-25 | Discharge: 2020-06-25 | Payer: MEDICARE

## 2020-06-25 ENCOUNTER — Encounter: Admit: 2020-06-25 | Discharge: 2020-06-25 | Payer: MEDICARE

## 2020-06-25 DIAGNOSIS — E782 Mixed hyperlipidemia: Secondary | ICD-10-CM

## 2020-06-25 DIAGNOSIS — M1990 Unspecified osteoarthritis, unspecified site: Secondary | ICD-10-CM

## 2020-06-25 DIAGNOSIS — R002 Palpitations: Secondary | ICD-10-CM

## 2020-06-25 DIAGNOSIS — E669 Obesity, unspecified: Secondary | ICD-10-CM

## 2020-06-25 DIAGNOSIS — M8580 Other specified disorders of bone density and structure, unspecified site: Secondary | ICD-10-CM

## 2020-06-25 DIAGNOSIS — I10 Essential (primary) hypertension: Secondary | ICD-10-CM

## 2020-06-25 DIAGNOSIS — I499 Cardiac arrhythmia, unspecified: Secondary | ICD-10-CM

## 2020-06-25 DIAGNOSIS — E785 Hyperlipidemia, unspecified: Secondary | ICD-10-CM

## 2020-06-25 NOTE — Progress Notes
Date of Service: 06/25/2020    Shari Spence is a 84 y.o. female.       HPI     Shari Spence is an 84 year old female who I am seeing today for routine follow-up.  We have followed her primarily for dyslipidemia as well as blood pressure management.  Over the last 12 months, she has not had any new subjective complaints.  She is continue to be active and denies any new exertional chest discomfort or shortness of breath.  I reviewed her most recent lab work and she is going be due for updated fasting lipid profile as well as comprehensive metabolic profile, but otherwise has no other new subjective cardiovascular complaints.         Vitals:    06/25/20 0917   BP: 136/70   BP Source: Arm, Right Upper   Patient Position: Sitting   Pulse: 50   Weight: 74.5 kg (164 lb 4.8 oz)   Height: 1.727 m (5' 8)   PainSc: Zero     Body mass index is 24.98 kg/m?Marland Kitchen     Past Medical History  Patient Active Problem List    Diagnosis Date Noted   ? Melena 10/12/2018   ? Overweight (BMI 25.0-29.9) 09/02/2011   ? Hyperlipemia 05/07/2009     on statin therapy and LDL is not at goal     ? Palpitations 05/06/2009     been experiencing heart palpitations for more than 30 years     ? HTN (hypertension) 05/06/2009     Borderline hypertension - fairly well controlled only with diuretics     ? Mitral valve prolapse 05/06/2009     Pt had Echo 4/08 demonstrated normal left ventricular systolic function, EF 50%, no valvular abnormalities, no evidence of mitral valve prolapse     ? Arthritis 05/06/2009   ? Osteopenia 04/09/2007         Review of Systems   Constitution: Negative.   HENT: Negative.    Eyes: Negative.    Cardiovascular: Negative.    Respiratory: Negative.    Endocrine: Negative.    Hematologic/Lymphatic: Negative.    Skin: Negative.    Musculoskeletal: Negative.    Gastrointestinal: Negative.    Genitourinary: Negative.    Neurological: Negative.    Psychiatric/Behavioral: Negative.    Allergic/Immunologic: Negative.    All other systems reviewed and are negative.      Physical Exam  Physical Exam  GEN: alert and oriented, no acute distress  HEENT: unremarkable, EOMI, PERRL  CHEST: Clear to auscultation bilaterally without focal rales, wheezes or rhonchi  CV: Reg rhythm, nml rate; nml S1 & S2, no S3 or S4; no rub; no murmurs  ABD: soft, nontender, BS+, no masses or bruits, no organomegaly   EXT: no c/c/e, 2+ distal pulses  NEURO: nonfocal  Mood and Affect: appropriate      Cardiovascular Studies  ECG - NSR    Problems Addressed Today  Encounter Diagnoses   Name Primary?   ? Mixed hyperlipidemia Yes   ? Palpitations    ? Essential hypertension        Assessment and Plan     1. Dyslipidemia-we will plan to repeat a fasting lipid profile as well as comprehensive metabolic profile.  She has been intolerant to statins but is currently on Zetia 10 mg daily.  I would like to target an LDL of less than 100 if possible.  Additionally, she has had an elevated hemoglobin A1c we will obtain a CBC  given that we are doing a blood draw so that her primary care physician can have updated labs from the summer.    Appreciate being involved with her care and will look for the results of her upcoming fasting lipid profile.         Current Medications (including today's revisions)  ? brimonidine (ALPHAGAN P) 0.15 % ophthalmic solution Apply 1 drop to both eyes three times daily.   ? calcium carbonate/vitamin D3 (CALCIUM 600 + D PO) Take 2 tablets by mouth daily.   ? cholecalciferol(+) (VITAMIN D-3) 5,000 unit tablet Take 5,000 Units by mouth daily.   ? CoQ10 (Ubiquinol) 200 mg cap Take 1 capsule by mouth daily.   ? cyanocobalamin (vitamin B-12) (VITAMIN B-12) 5,000 mcg subl Place 1 tablet under tongue daily.   ? Diphenhydramine-Acetaminophen (TYLENOL PM EXTRA STRENGTH) 25-500 mg tab tablet Take 1 tablet by mouth at bedtime daily.   ? docusate (COLACE) 100 mg capsule Take 100 mg by mouth daily.   ? dorzolamide-timolol (PF) 2-0.5 % drop Place 1 drop into or around eye(s) twice daily.   ? ezetimibe (ZETIA) 10 mg tablet Take 10 mg by mouth daily.   ? Folic Acid 800 mcg tab Take 1 tablet by mouth daily.   ? hydroCHLOROthiazide (HYDRODIURIL) 12.5 mg tablet Take 12.5 mg by mouth every morning.   ? hydrocortisone 2.5 % topical cream Apply  topically to affected area twice daily.   ? Lactobacillus acidophilus (FLORAJEN) 460 mg (20 billion cell) cap Take 1 capsule by mouth every morning.   ? latanoprost (XALATAN) 0.005 % ophthalmic solution Place 1 Drop into or around eye(s) at bedtime daily.   ? losartan (COZAAR) 50 mg tablet Take 50 mg by mouth daily.   ? melatonin 10 mg tab Take 1 tablet by mouth at bedtime daily.   ? Omega-3 Fatty Acids-Vitamin E (FISH OIL) 1,000 mg Cap Take 1 capsule by mouth daily.   ? other medication Glucomasine/Chondroitin 1500/200: take 2 tablets by mouth daily   ? Potassium 99 mg tab Take 1 tablet by mouth daily.   ? tacrolimus(+) (PROTOPIC) 0.1 % topical ointment Apply  topically to affected area twice daily.   ? vitamins, multi w/minerals 9 mg iron-400 mcg tab Take 1 tablet by mouth daily.

## 2020-06-30 ENCOUNTER — Encounter: Admit: 2020-06-30 | Discharge: 2020-06-30 | Payer: MEDICARE

## 2020-06-30 DIAGNOSIS — E782 Mixed hyperlipidemia: Secondary | ICD-10-CM

## 2020-06-30 DIAGNOSIS — R2232 Localized swelling, mass and lump, left upper limb: Secondary | ICD-10-CM

## 2020-06-30 MED ORDER — PRAVASTATIN 10 MG PO TAB
10 mg | ORAL_TABLET | Freq: Every evening | ORAL | 1 refills | 90.00000 days | Status: AC
Start: 2020-06-30 — End: ?

## 2020-06-30 NOTE — Telephone Encounter
Reviewed FLP with Dr. Paris Lore. Per Dr. Paris Lore, pt to add Pravastatin 10mg  daily to medication regimen and recheck LFT and FLP in 3 months.    Connected with pt. Informed pt of results and Dr. Oda Kilts recommendations.  Patient verbalized understanding and does not have any questions or concerns.  Will send lab orders in mail in about 3 months. Pt agreeable to plan.

## 2020-08-18 ENCOUNTER — Encounter: Admit: 2020-08-18 | Discharge: 2020-08-18 | Payer: MEDICARE

## 2020-08-18 DIAGNOSIS — R2232 Localized swelling, mass and lump, left upper limb: Secondary | ICD-10-CM

## 2020-08-19 ENCOUNTER — Encounter: Admit: 2020-08-19 | Discharge: 2020-08-19 | Payer: MEDICARE

## 2020-08-19 ENCOUNTER — Ambulatory Visit: Admit: 2020-08-19 | Discharge: 2020-08-19 | Payer: MEDICARE

## 2020-08-19 DIAGNOSIS — R2232 Localized swelling, mass and lump, left upper limb: Secondary | ICD-10-CM

## 2020-08-19 DIAGNOSIS — I499 Cardiac arrhythmia, unspecified: Secondary | ICD-10-CM

## 2020-08-19 DIAGNOSIS — M1990 Unspecified osteoarthritis, unspecified site: Secondary | ICD-10-CM

## 2020-08-19 DIAGNOSIS — E785 Hyperlipidemia, unspecified: Secondary | ICD-10-CM

## 2020-08-19 DIAGNOSIS — R002 Palpitations: Secondary | ICD-10-CM

## 2020-08-19 DIAGNOSIS — M8580 Other specified disorders of bone density and structure, unspecified site: Secondary | ICD-10-CM

## 2020-08-19 DIAGNOSIS — I10 Essential (primary) hypertension: Secondary | ICD-10-CM

## 2020-08-19 DIAGNOSIS — E669 Obesity, unspecified: Secondary | ICD-10-CM

## 2020-09-09 ENCOUNTER — Encounter: Admit: 2020-09-09 | Discharge: 2020-09-09 | Payer: MEDICARE

## 2020-09-09 ENCOUNTER — Ambulatory Visit: Admit: 2020-09-09 | Discharge: 2020-09-09 | Payer: MEDICARE

## 2020-09-09 DIAGNOSIS — R2232 Localized swelling, mass and lump, left upper limb: Secondary | ICD-10-CM

## 2020-09-10 ENCOUNTER — Emergency Department: Admit: 2020-09-10 | Discharge: 2020-09-09 | Payer: MEDICARE

## 2020-09-10 LAB — COMPREHENSIVE METABOLIC PANEL
Lab: 0.6 mg/dL (ref 0.3–1.2)
Lab: 0.8 mg/dL (ref 0.4–1.00)
Lab: 10 mg/dL (ref 8.5–10.6)
Lab: 11 K/UL (ref 3–12)
Lab: 140 MMOL/L (ref 137–147)
Lab: 22 U/L (ref 7–40)
Lab: 26 MMOL/L (ref 21–30)
Lab: 4.8 g/dL (ref 3.5–5.0)
Lab: 8.2 g/dL — ABNORMAL HIGH (ref 6.0–8.0)
Lab: 83 mg/dL (ref 70–100)
Lab: >60 mL/min (ref >60)

## 2020-09-10 LAB — CBC AND DIFF
Lab: 0 10*3/uL (ref 0–0.20)
Lab: 0.1 10*3/uL (ref 0–0.45)
Lab: 0.7 K/UL (ref >60)
Lab: 1 % (ref 0–2)
Lab: 8.9 10*3/uL (ref 4.5–11.0)
Lab: 18 (ref <20.7)

## 2020-09-10 LAB — PTT (APTT): Lab: 36 s (ref 24.0–36.5)

## 2020-09-10 LAB — PROTIME INR (PT): Lab: 1 MMOL/L (ref 0.8–1.2)

## 2020-09-10 NOTE — ED Notes
84 y/o female presents to ED with a chief complaint of frequent diarrhea. Pt reports she has been having frequent diarrhea for about 4-5 months. Pt reports 10+ loose stools a day and is continent/incontinent. Pt states that today she began to see black in the stools about 7 times today. Pt declines a change in weight be states if she does not eat or drink it helps avoid having loose stools. Pt also came in with high blood pressure but states that this is common for anytime she comes to a medical facility. Pt is seen by a cardiologist and takes lisinopril and has not missed any doses. Pt declines pain, SOB, HA, N/V, dizziness, or chest pain.    Pt is hooked up to monitors, bed locked on lowest position and bed rails up X2. Pt respirations are symmetrical and unlabored. Pt husband at bedside.     Belongings:  Shirt  Bra  Pants  shoes

## 2020-09-16 ENCOUNTER — Ambulatory Visit: Admit: 2020-09-16 | Discharge: 2020-09-16 | Payer: MEDICARE

## 2020-09-16 ENCOUNTER — Encounter: Admit: 2020-09-16 | Discharge: 2020-09-16 | Payer: MEDICARE

## 2020-09-16 DIAGNOSIS — R2232 Localized swelling, mass and lump, left upper limb: Secondary | ICD-10-CM

## 2020-09-16 DIAGNOSIS — E669 Obesity, unspecified: Secondary | ICD-10-CM

## 2020-09-16 DIAGNOSIS — M1990 Unspecified osteoarthritis, unspecified site: Secondary | ICD-10-CM

## 2020-09-16 DIAGNOSIS — I499 Cardiac arrhythmia, unspecified: Secondary | ICD-10-CM

## 2020-09-16 DIAGNOSIS — I10 Essential (primary) hypertension: Secondary | ICD-10-CM

## 2020-09-16 DIAGNOSIS — M8580 Other specified disorders of bone density and structure, unspecified site: Secondary | ICD-10-CM

## 2020-09-16 DIAGNOSIS — E785 Hyperlipidemia, unspecified: Secondary | ICD-10-CM

## 2020-09-16 DIAGNOSIS — R002 Palpitations: Secondary | ICD-10-CM

## 2020-09-18 ENCOUNTER — Encounter: Admit: 2020-09-18 | Discharge: 2020-09-18 | Payer: MEDICARE

## 2020-09-18 ENCOUNTER — Ambulatory Visit: Admit: 2020-09-18 | Discharge: 2020-09-18 | Payer: MEDICARE

## 2020-09-18 DIAGNOSIS — Z20822 Encounter for screening laboratory testing for COVID-19 virus in asymptomatic patient: Secondary | ICD-10-CM

## 2020-09-18 DIAGNOSIS — R2232 Localized swelling, mass and lump, left upper limb: Secondary | ICD-10-CM

## 2020-09-18 MED ORDER — CEFAZOLIN 1 GRAM IJ SOLR
2 g | Freq: Once | INTRAVENOUS | 0 refills
Start: 2020-09-18 — End: ?

## 2020-09-18 NOTE — Telephone Encounter
RN made phone contact with patient to schedule surgery. Swab clinic appointment 10/13/20 @ 1p [facility], surgery 10/16/20 [facility], POV 10/28/20 @ 1p [facility]. All questions answered, pt verbalized understanding of all appt dates/times/locations. RN emailed pt written instructions for swab clinic appt and surgery. RN advised pt to contact office with any further questions.

## 2020-09-29 ENCOUNTER — Encounter: Admit: 2020-09-29 | Discharge: 2020-09-29 | Payer: MEDICARE

## 2020-10-12 ENCOUNTER — Encounter: Admit: 2020-10-12 | Discharge: 2020-10-12 | Payer: MEDICARE

## 2020-10-12 DIAGNOSIS — R002 Palpitations: Secondary | ICD-10-CM

## 2020-10-12 DIAGNOSIS — I10 Essential (primary) hypertension: Secondary | ICD-10-CM

## 2020-10-12 DIAGNOSIS — I499 Cardiac arrhythmia, unspecified: Secondary | ICD-10-CM

## 2020-10-12 DIAGNOSIS — M1990 Unspecified osteoarthritis, unspecified site: Secondary | ICD-10-CM

## 2020-10-12 DIAGNOSIS — E782 Mixed hyperlipidemia: Secondary | ICD-10-CM

## 2020-10-12 DIAGNOSIS — E785 Hyperlipidemia, unspecified: Secondary | ICD-10-CM

## 2020-10-12 DIAGNOSIS — E669 Obesity, unspecified: Secondary | ICD-10-CM

## 2020-10-12 DIAGNOSIS — M8580 Other specified disorders of bone density and structure, unspecified site: Secondary | ICD-10-CM

## 2020-10-12 LAB — LIPID PROFILE
Lab: 125 U/L — ABNORMAL HIGH (ref 25–110)
Lab: 176 g/dL — ABNORMAL LOW (ref 3.5–5.0)
Lab: 25 U/L — ABNORMAL HIGH (ref 7–56)
Lab: 3 mL/min — ABNORMAL LOW (ref >60)
Lab: 62 U/L (ref 7–40)
Lab: 89 MMOL/L — ABNORMAL HIGH (ref 21–30)

## 2020-10-13 ENCOUNTER — Encounter: Admit: 2020-10-13 | Discharge: 2020-10-14 | Payer: MEDICARE

## 2020-10-15 ENCOUNTER — Encounter: Admit: 2020-10-15 | Discharge: 2020-10-15 | Payer: MEDICARE

## 2020-10-16 ENCOUNTER — Encounter: Admit: 2020-10-16 | Discharge: 2020-10-16 | Payer: MEDICARE

## 2020-10-16 DIAGNOSIS — I499 Cardiac arrhythmia, unspecified: Secondary | ICD-10-CM

## 2020-10-16 DIAGNOSIS — M8580 Other specified disorders of bone density and structure, unspecified site: Secondary | ICD-10-CM

## 2020-10-16 DIAGNOSIS — E785 Hyperlipidemia, unspecified: Secondary | ICD-10-CM

## 2020-10-16 DIAGNOSIS — M1990 Unspecified osteoarthritis, unspecified site: Secondary | ICD-10-CM

## 2020-10-16 DIAGNOSIS — I10 Essential (primary) hypertension: Secondary | ICD-10-CM

## 2020-10-16 DIAGNOSIS — R002 Palpitations: Secondary | ICD-10-CM

## 2020-10-16 DIAGNOSIS — E669 Obesity, unspecified: Secondary | ICD-10-CM

## 2020-10-16 MED ORDER — PROPOFOL INJ 10 MG/ML IV VIAL
INTRAVENOUS | 0 refills | Status: DC
Start: 2020-10-16 — End: 2020-10-16

## 2020-10-16 MED ORDER — PROPOFOL 10 MG/ML IV EMUL 50 ML (INFUSION)(AM)(OR)
INTRAVENOUS | 0 refills | Status: DC
Start: 2020-10-16 — End: 2020-10-16

## 2020-10-16 MED ORDER — FENTANYL CITRATE (PF) 50 MCG/ML IJ SOLN
INTRAVENOUS | 0 refills | Status: DC
Start: 2020-10-16 — End: 2020-10-16

## 2020-10-16 MED ORDER — LIDOCAINE (PF) 100 MG/5 ML (2 %) IV SYRG
INTRAVENOUS | 0 refills | Status: DC
Start: 2020-10-16 — End: 2020-10-16

## 2020-10-16 MED ORDER — ONDANSETRON HCL (PF) 4 MG/2 ML IJ SOLN
INTRAVENOUS | 0 refills | Status: DC
Start: 2020-10-16 — End: 2020-10-16

## 2020-10-28 ENCOUNTER — Encounter: Admit: 2020-10-28 | Discharge: 2020-10-28 | Payer: MEDICARE

## 2020-10-28 ENCOUNTER — Ambulatory Visit: Admit: 2020-10-28 | Discharge: 2020-10-28 | Payer: MEDICARE

## 2020-10-28 DIAGNOSIS — R2232 Localized swelling, mass and lump, left upper limb: Secondary | ICD-10-CM

## 2020-10-28 DIAGNOSIS — M8580 Other specified disorders of bone density and structure, unspecified site: Secondary | ICD-10-CM

## 2020-10-28 DIAGNOSIS — E785 Hyperlipidemia, unspecified: Secondary | ICD-10-CM

## 2020-10-28 DIAGNOSIS — I10 Essential (primary) hypertension: Secondary | ICD-10-CM

## 2020-10-28 DIAGNOSIS — I499 Cardiac arrhythmia, unspecified: Secondary | ICD-10-CM

## 2020-10-28 DIAGNOSIS — E669 Obesity, unspecified: Secondary | ICD-10-CM

## 2020-10-28 DIAGNOSIS — M1990 Unspecified osteoarthritis, unspecified site: Secondary | ICD-10-CM

## 2020-10-28 DIAGNOSIS — R002 Palpitations: Secondary | ICD-10-CM

## 2020-10-28 NOTE — Progress Notes
Shari Spence returns to clinic for her first postoperative visit following her left forearm mass excision.  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  She has recovered well following surgery.  She notes minimal pain in the forearm.  She has regained all of her wrist and hand motion.  Her incision is well-healed, the wound edges are well approximated there is no erythema or signs of infection.  She is provided wound care instructions.  She does have a mild hematoma where the lipoma was.  We have discussed that the development of a hematoma would not be uncommon after a mass is removed after surgery.  This hematoma should continue to consolidate and resolve with time.  We have asked her to return to clinic for recheck in 4 to 6 weeks or sooner if she has other concerns.  She may call us at anytime with questions or concerns. She left today after all of her questions were answered.

## 2020-12-02 ENCOUNTER — Ambulatory Visit: Admit: 2020-12-02 | Discharge: 2020-12-02 | Payer: MEDICARE

## 2020-12-02 ENCOUNTER — Encounter: Admit: 2020-12-02 | Discharge: 2020-12-02 | Payer: MEDICARE

## 2020-12-02 DIAGNOSIS — I499 Cardiac arrhythmia, unspecified: Secondary | ICD-10-CM

## 2020-12-02 DIAGNOSIS — M8580 Other specified disorders of bone density and structure, unspecified site: Secondary | ICD-10-CM

## 2020-12-02 DIAGNOSIS — I10 Essential (primary) hypertension: Secondary | ICD-10-CM

## 2020-12-02 DIAGNOSIS — E669 Obesity, unspecified: Secondary | ICD-10-CM

## 2020-12-02 DIAGNOSIS — M1990 Unspecified osteoarthritis, unspecified site: Secondary | ICD-10-CM

## 2020-12-02 DIAGNOSIS — E785 Hyperlipidemia, unspecified: Secondary | ICD-10-CM

## 2020-12-02 DIAGNOSIS — R002 Palpitations: Secondary | ICD-10-CM

## 2020-12-09 ENCOUNTER — Encounter: Admit: 2020-12-09 | Discharge: 2020-12-09 | Payer: MEDICARE

## 2020-12-09 MED ORDER — PRAVASTATIN 10 MG PO TAB
ORAL_TABLET | Freq: Every day | ORAL | 0 refills | 90.00000 days | Status: AC
Start: 2020-12-09 — End: ?

## 2021-03-10 ENCOUNTER — Encounter: Admit: 2021-03-10 | Discharge: 2021-03-10 | Payer: MEDICARE

## 2021-03-10 MED ORDER — PRAVASTATIN 10 MG PO TAB
ORAL_TABLET | Freq: Every day | ORAL | 2 refills | 90.00000 days | Status: AC
Start: 2021-03-10 — End: ?

## 2021-04-28 ENCOUNTER — Encounter: Admit: 2021-04-28 | Discharge: 2021-04-28 | Payer: MEDICARE

## 2021-04-28 DIAGNOSIS — E782 Mixed hyperlipidemia: Secondary | ICD-10-CM

## 2021-04-28 NOTE — Telephone Encounter
Pt LVM requesting lipid profile be mailed to pt prior to appt with Dr. Lorriane Shire on 6/27 as the appt is late in the day and pt does not want to fast all day. Will mail order to pt.

## 2021-05-18 ENCOUNTER — Encounter: Admit: 2021-05-18 | Discharge: 2021-05-18 | Payer: MEDICARE

## 2021-05-18 DIAGNOSIS — E782 Mixed hyperlipidemia: Secondary | ICD-10-CM

## 2021-06-21 ENCOUNTER — Encounter: Admit: 2021-06-21 | Discharge: 2021-06-21 | Payer: MEDICARE

## 2021-06-21 ENCOUNTER — Ambulatory Visit: Admit: 2021-06-21 | Discharge: 2021-06-21 | Payer: MEDICARE

## 2021-06-21 DIAGNOSIS — M8580 Other specified disorders of bone density and structure, unspecified site: Secondary | ICD-10-CM

## 2021-06-21 DIAGNOSIS — R002 Palpitations: Secondary | ICD-10-CM

## 2021-06-21 DIAGNOSIS — E785 Hyperlipidemia, unspecified: Secondary | ICD-10-CM

## 2021-06-21 DIAGNOSIS — I499 Cardiac arrhythmia, unspecified: Secondary | ICD-10-CM

## 2021-06-21 DIAGNOSIS — I10 Essential (primary) hypertension: Secondary | ICD-10-CM

## 2021-06-21 DIAGNOSIS — E782 Mixed hyperlipidemia: Secondary | ICD-10-CM

## 2021-06-21 DIAGNOSIS — M1990 Unspecified osteoarthritis, unspecified site: Secondary | ICD-10-CM

## 2021-06-21 DIAGNOSIS — E669 Obesity, unspecified: Secondary | ICD-10-CM

## 2021-06-21 NOTE — Patient Instructions
We will plan to have you see Dr. Royce Macadamia    Endocrinology would be Dr. Terrace Arabia    We plan to see you back in a year with a repeat FLP

## 2021-11-30 ENCOUNTER — Encounter: Admit: 2021-11-30 | Discharge: 2021-11-30 | Payer: MEDICARE

## 2021-11-30 MED ORDER — PRAVASTATIN 10 MG PO TAB
ORAL_TABLET | Freq: Every day | ORAL | 0 refills | 90.00000 days | Status: AC
Start: 2021-11-30 — End: ?

## 2021-12-14 ENCOUNTER — Encounter: Admit: 2021-12-14 | Discharge: 2021-12-14 | Payer: MEDICARE

## 2021-12-17 ENCOUNTER — Encounter: Admit: 2021-12-17 | Discharge: 2021-12-17 | Payer: MEDICARE

## 2021-12-17 DIAGNOSIS — J449 Chronic obstructive pulmonary disease, unspecified: Secondary | ICD-10-CM

## 2021-12-27 IMAGING — CR CHEST PA
2 series · 2 of 2 positions shown · non-contrast
Comparison: none

[chest pa]
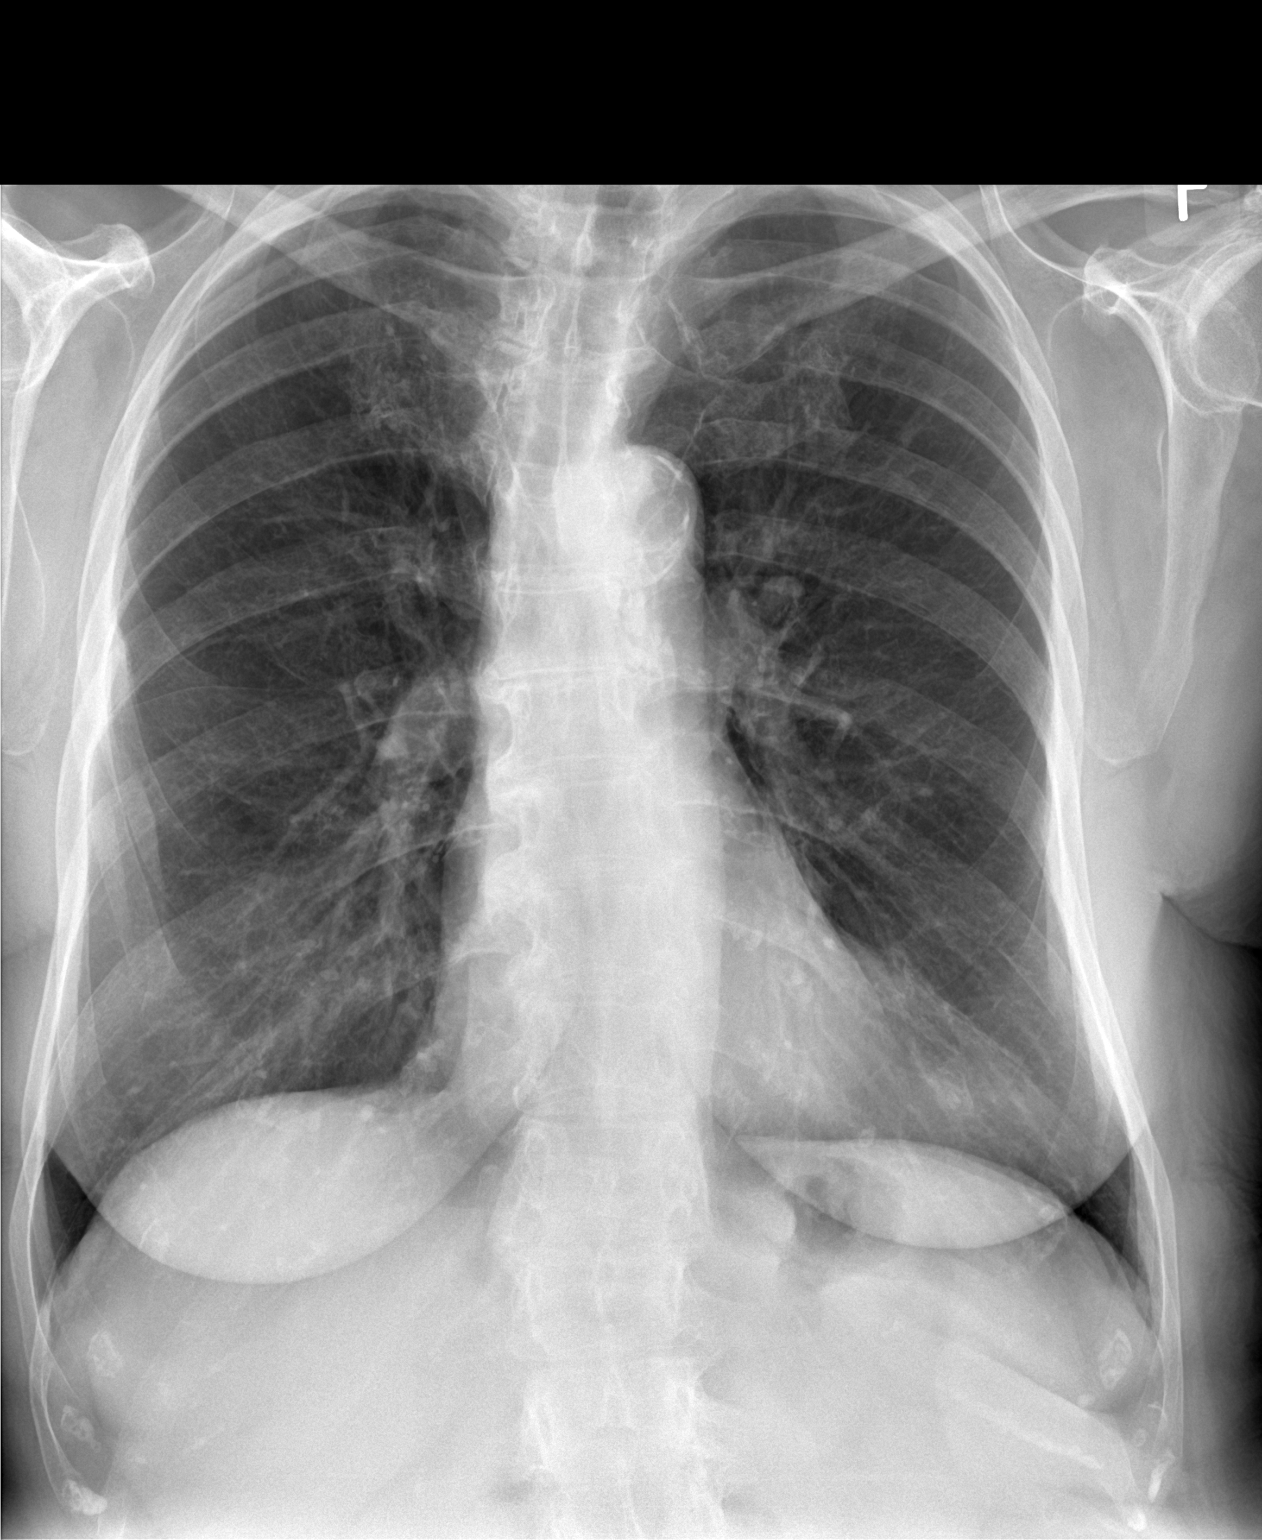

[chest lat]
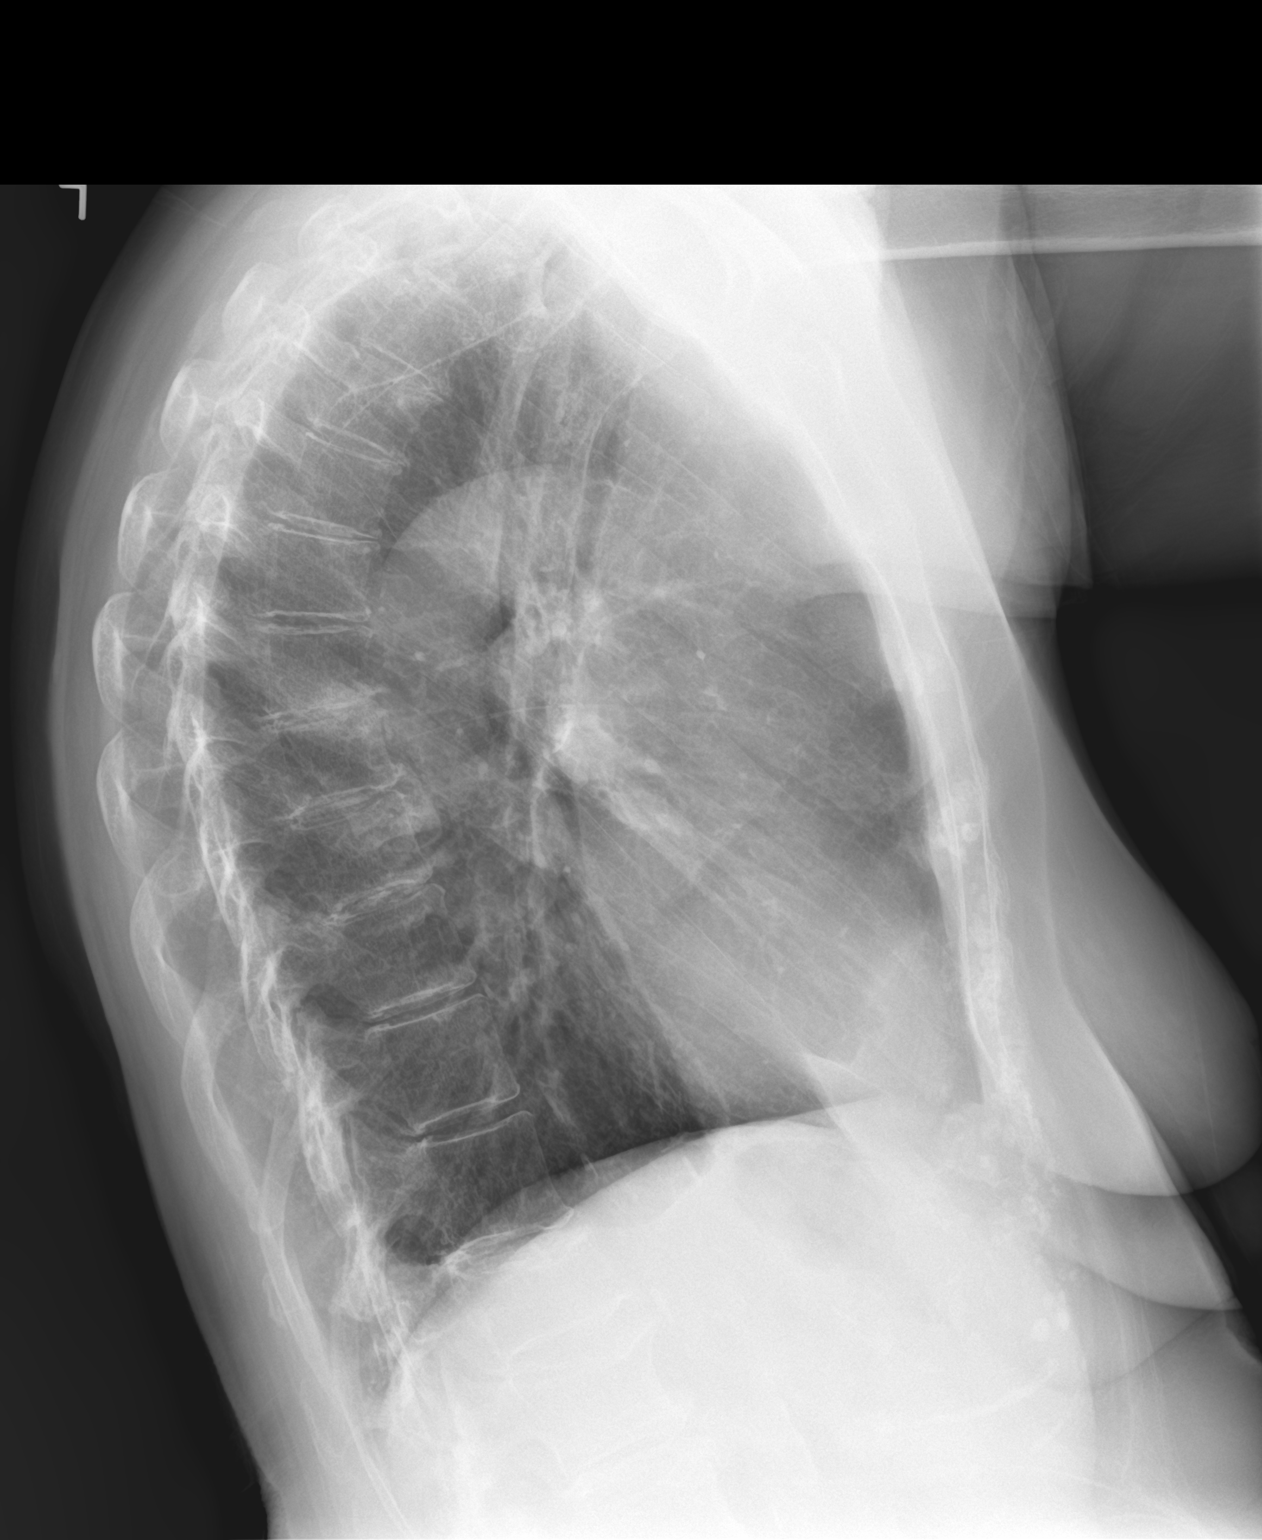

[2 of 2 positions shown; findings below may reference images not displayed]

EXAM

RADIOLOGICAL EXAMINATION, CHEST; 2 VIEWS FRONTAL AND LATERAL CPT 13999

INDICATION

Irregular heart rate. Right sided rib/chest pain, history x of rib fracture.

TECHNIQUE

2 views of the chest were acquired.

COMPARISONS

Previous examination dated 10/25/2021.

FINDINGS

The cardiac silhouette is within normal limits. The lungs are clear. The pulmonary vasculature is
normal in caliber.

The aortic arch calcified. The costophrenic angles are sharp. The bony structures appear
demineralized.

IMPRESSION

No acute cardiopulmonary process. Stable appearance of the chest.

Tech Notes:

irregular heart, rt sided rib/chest pain, hx of rib fx

## 2021-12-29 ENCOUNTER — Encounter: Admit: 2021-12-29 | Discharge: 2021-12-29 | Payer: MEDICARE

## 2021-12-29 ENCOUNTER — Ambulatory Visit: Admit: 2021-12-29 | Discharge: 2021-12-29 | Payer: MEDICARE

## 2021-12-29 DIAGNOSIS — R197 Diarrhea, unspecified: Secondary | ICD-10-CM

## 2021-12-29 DIAGNOSIS — E669 Obesity, unspecified: Secondary | ICD-10-CM

## 2021-12-29 DIAGNOSIS — E785 Hyperlipidemia, unspecified: Secondary | ICD-10-CM

## 2021-12-29 DIAGNOSIS — R002 Palpitations: Secondary | ICD-10-CM

## 2021-12-29 DIAGNOSIS — I499 Cardiac arrhythmia, unspecified: Secondary | ICD-10-CM

## 2021-12-29 DIAGNOSIS — M8580 Other specified disorders of bone density and structure, unspecified site: Secondary | ICD-10-CM

## 2021-12-29 DIAGNOSIS — I10 Essential (primary) hypertension: Secondary | ICD-10-CM

## 2021-12-29 DIAGNOSIS — M1990 Unspecified osteoarthritis, unspecified site: Secondary | ICD-10-CM

## 2021-12-29 DIAGNOSIS — E782 Mixed hyperlipidemia: Secondary | ICD-10-CM

## 2022-01-03 IMAGING — CR PAPERORDER
2 series · 2 of 2 positions shown · non-contrast
Comparison: none

[abdomen kub supine (1 of 2)]
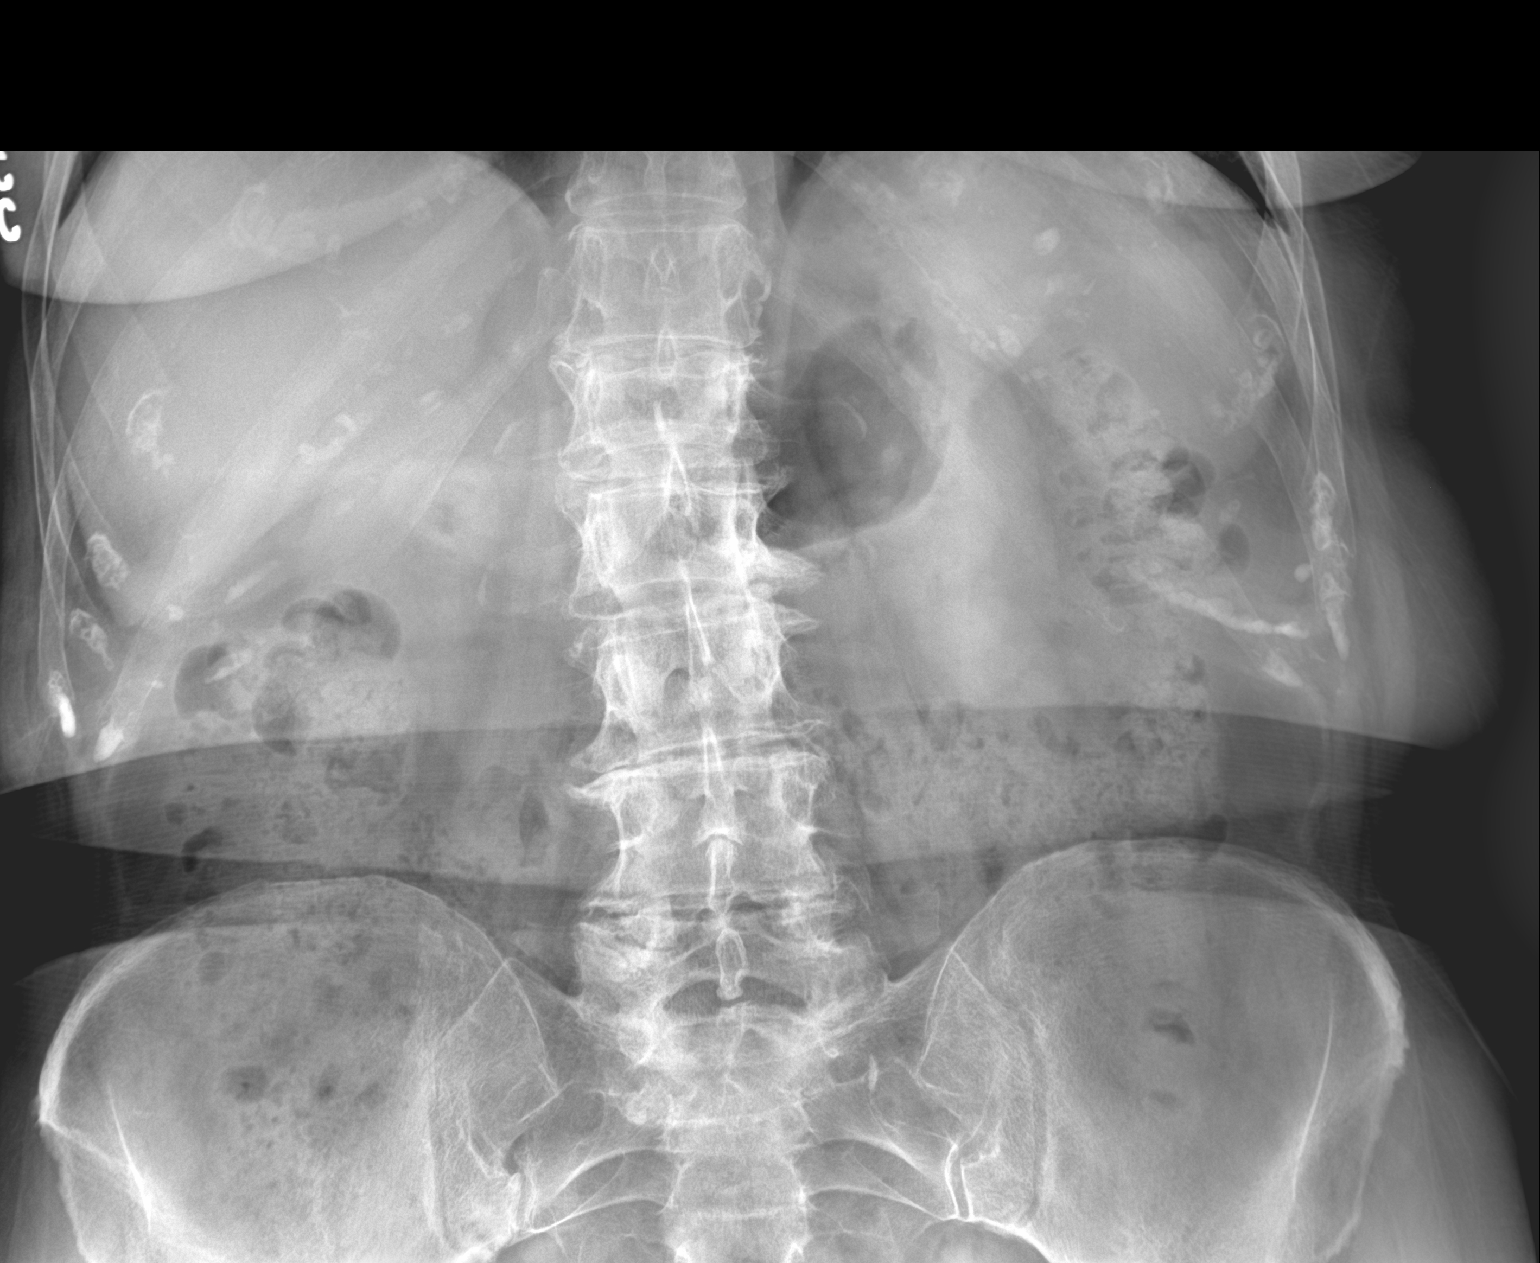

[abdomen kub supine (2 of 2)]
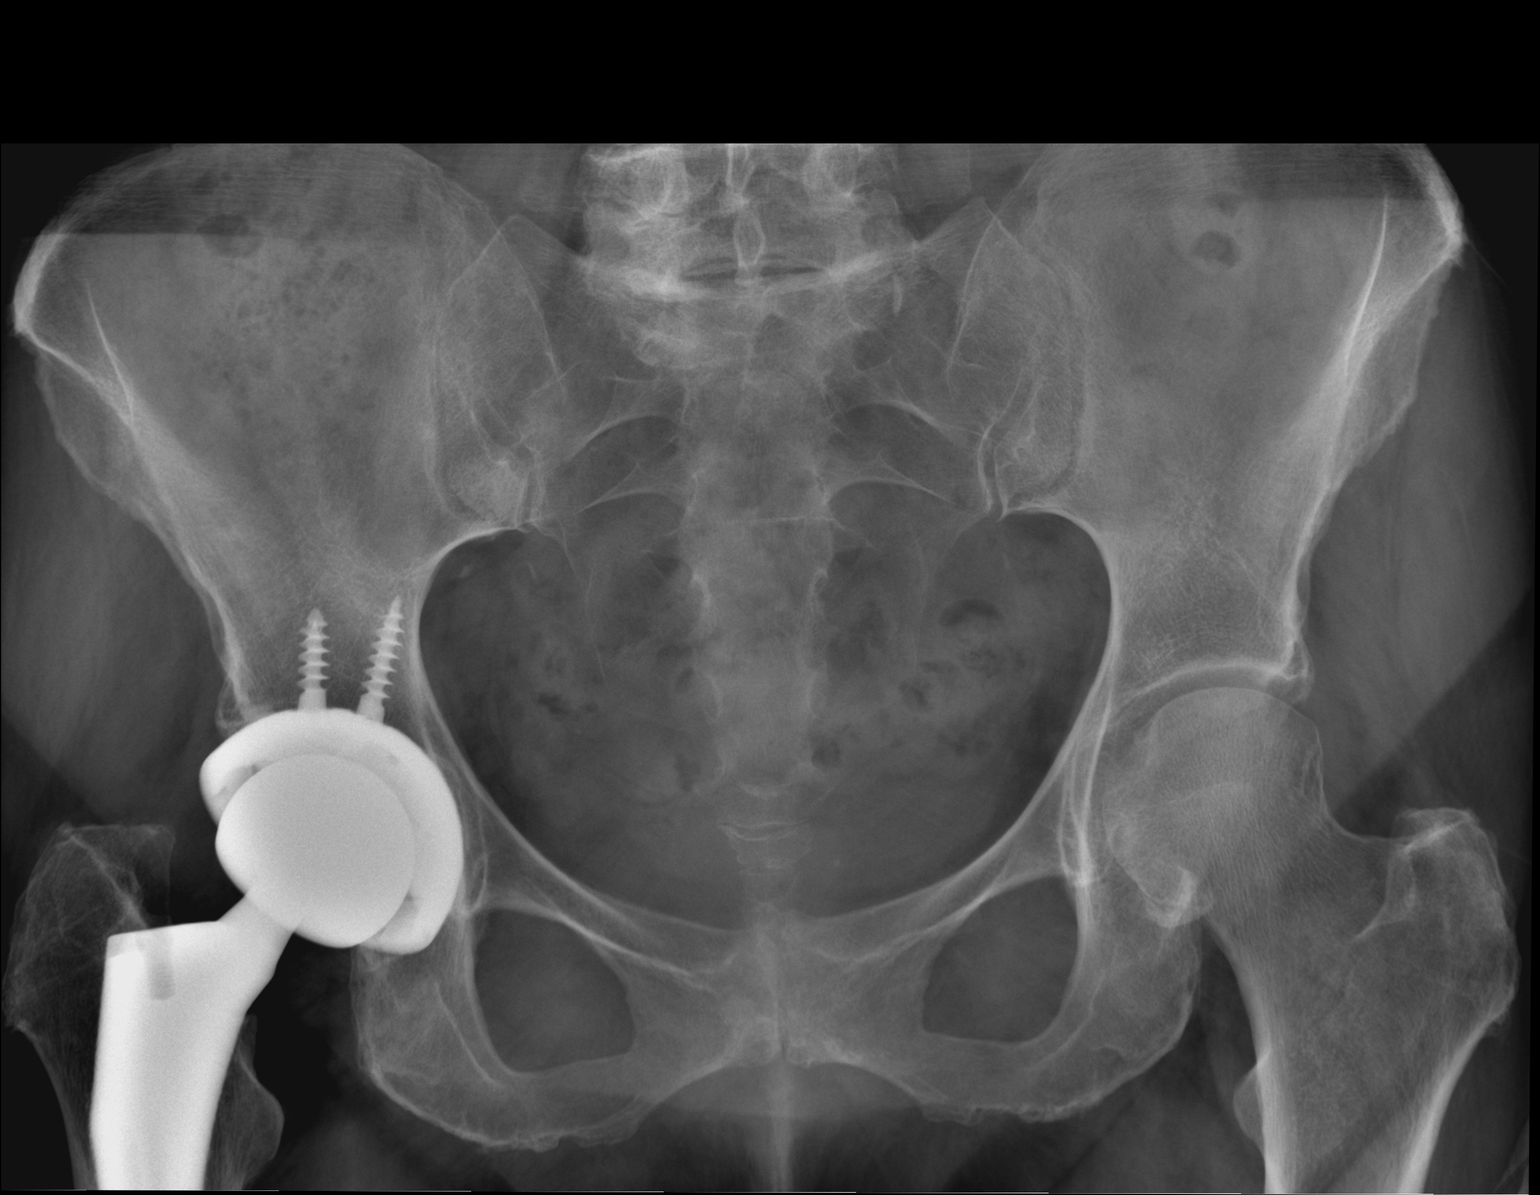

[2 of 2 positions shown; findings below may reference images not displayed]

DIAGNOSTIC STUDIES

EXAM

XR kub

INDICATION

Diarrhea, unspecified
Diarrhea x years.

TECHNIQUE

Standard KUB was obtained.

COMPARISONS

None available

FINDINGS

Moderate fecal material is noted throughout the colon. Small bowel gas is unremarkable without
distention. Degenerative changes of the spine are noted. Right hip arthroplasty is evident.

IMPRESSION

Moderate fecal material throughout the colon. There is no evidence for obstruction.

Tech Notes:

Diarrhea x years.

## 2022-01-05 ENCOUNTER — Encounter: Admit: 2022-01-05 | Discharge: 2022-01-05 | Payer: MEDICARE

## 2022-01-05 DIAGNOSIS — K5909 Other constipation: Secondary | ICD-10-CM

## 2022-01-06 ENCOUNTER — Ambulatory Visit: Admit: 2022-01-06 | Discharge: 2022-01-06 | Payer: MEDICARE

## 2022-01-06 DIAGNOSIS — K5909 Other constipation: Secondary | ICD-10-CM

## 2022-01-12 ENCOUNTER — Encounter: Admit: 2022-01-12 | Discharge: 2022-01-12 | Payer: MEDICARE

## 2022-01-12 DIAGNOSIS — R197 Diarrhea, unspecified: Secondary | ICD-10-CM

## 2022-01-13 ENCOUNTER — Encounter: Admit: 2022-01-13 | Discharge: 2022-01-13 | Payer: MEDICARE

## 2022-01-13 DIAGNOSIS — I499 Cardiac arrhythmia, unspecified: Secondary | ICD-10-CM

## 2022-01-13 DIAGNOSIS — E669 Obesity, unspecified: Secondary | ICD-10-CM

## 2022-01-13 DIAGNOSIS — M8580 Other specified disorders of bone density and structure, unspecified site: Secondary | ICD-10-CM

## 2022-01-13 DIAGNOSIS — R002 Palpitations: Secondary | ICD-10-CM

## 2022-01-13 DIAGNOSIS — I10 Essential (primary) hypertension: Secondary | ICD-10-CM

## 2022-01-13 DIAGNOSIS — E785 Hyperlipidemia, unspecified: Secondary | ICD-10-CM

## 2022-01-13 DIAGNOSIS — M1990 Unspecified osteoarthritis, unspecified site: Secondary | ICD-10-CM

## 2022-01-18 IMAGING — MR Head^Brain
7 of 8 series · 39 of 48 positions shown · non-contrast
Comparison: none

[Series 2: T1 · sagittal · 5.0mm · 0.45mm/px · 5 of 20 slices shown (1 of 2)]
[im 1/20]
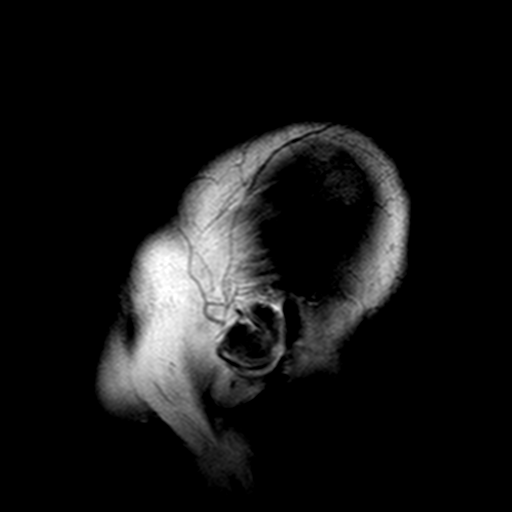
[im 5/20]
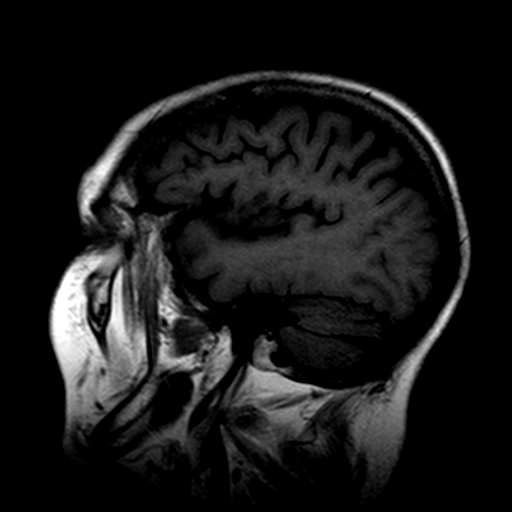
[im 10/20]
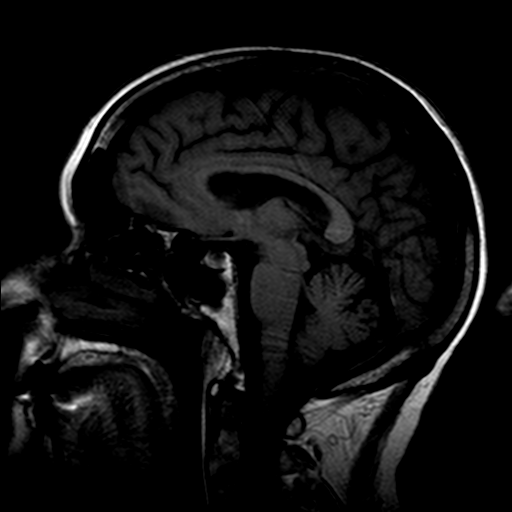
[im 15/20]
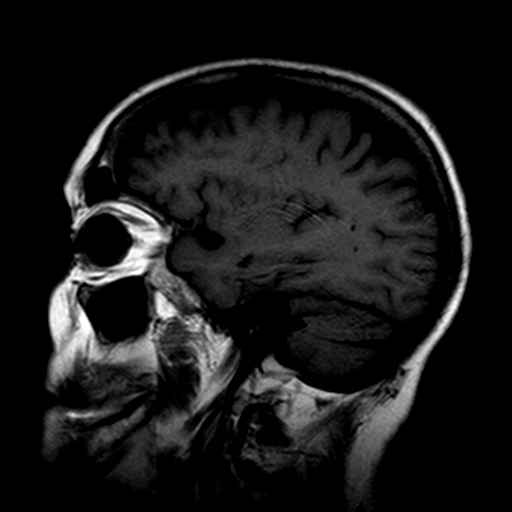
[im 20/20]
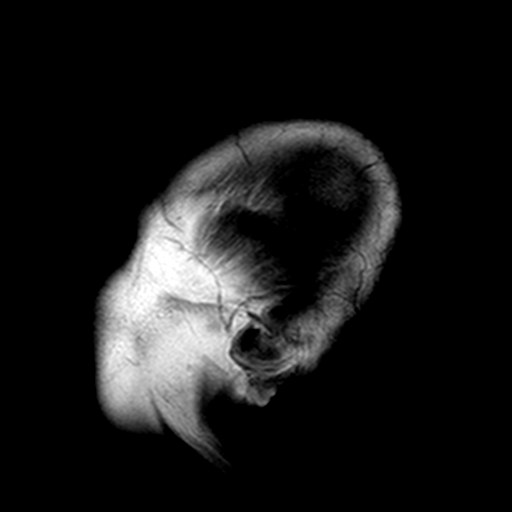

[Series 3: DWI · axial · 5.0mm · 1.80mm/px · z∈[-48,+76]mm · 9 of 63 slices shown (1 of 2)]
[im 1/63]
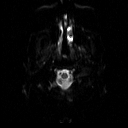
[im 11/63]
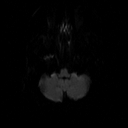
[im 21/63]
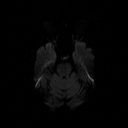
[im 26/63]
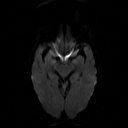
[im 32/63]
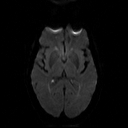
[im 37/63]
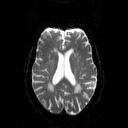
[im 42/63]
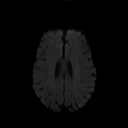
[im 52/63]
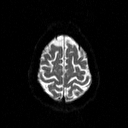
[im 63/63]
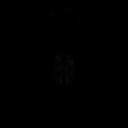

[Series 4: DWI · axial · 5.0mm · 1.80mm/px · z∈[-48,+76]mm · 4 of 21 slices shown (2 of 2)]
[im 1/21]
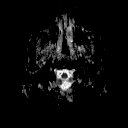
[im 7/21]
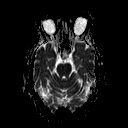
[im 14/21]
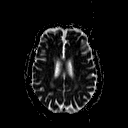
[im 21/21]
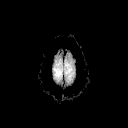

[Series 5: FLAIR · axial · 5.0mm · 0.45mm/px · z∈[-56,+86]mm · 5 of 24 slices shown]
[im 1/24]
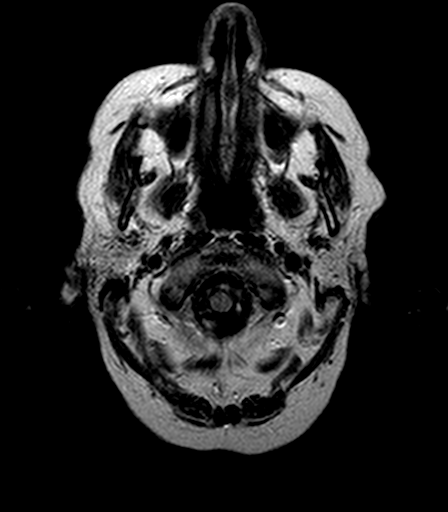
[im 6/24]
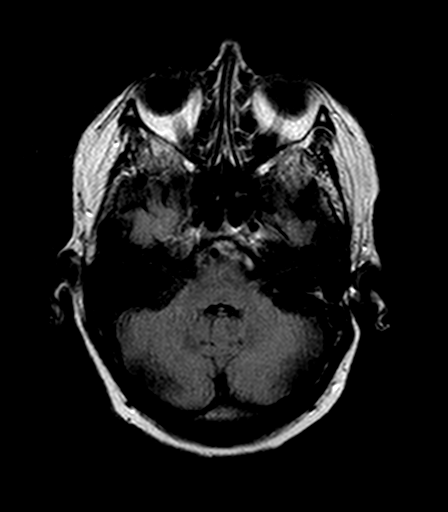
[im 12/24]
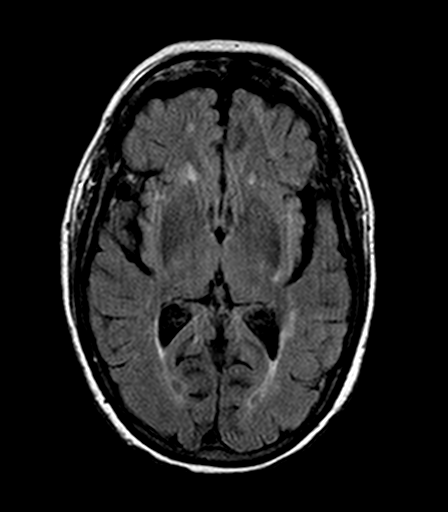
[im 18/24]
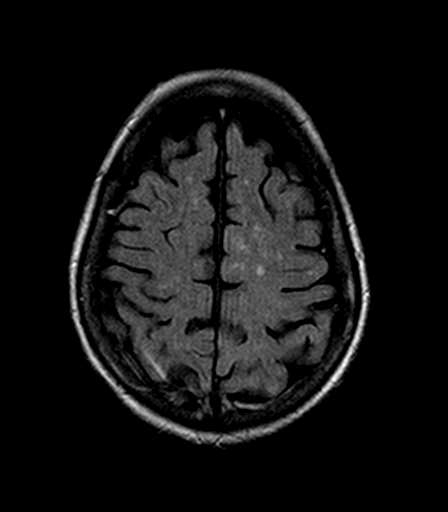
[im 24/24]
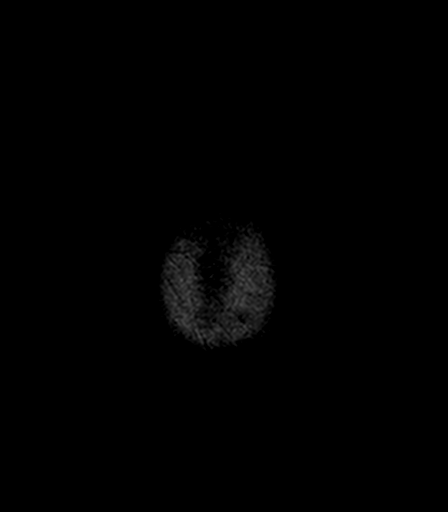

[Series 6: T2 · axial · 5.0mm · 0.72mm/px · z∈[-56,+86]mm · 5 of 24 slices shown (1 of 2)]
[im 1/24]
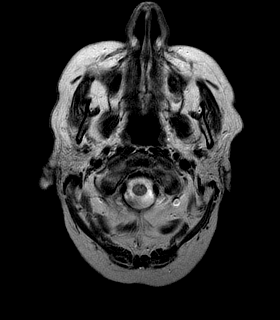
[im 6/24]
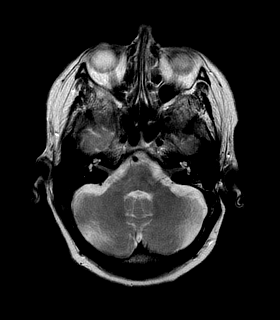
[im 12/24]
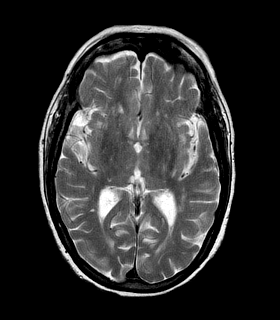
[im 18/24]
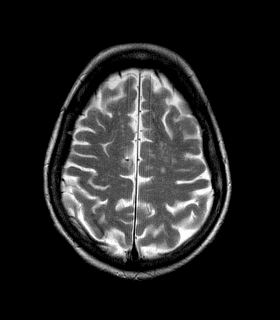
[im 24/24]
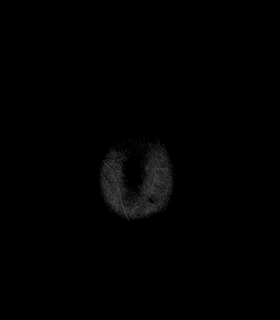

[Series 7: T1 · axial · 5.0mm · 0.45mm/px · z∈[-56,+86]mm · 5 of 24 slices shown (2 of 2)]
[im 1/24]
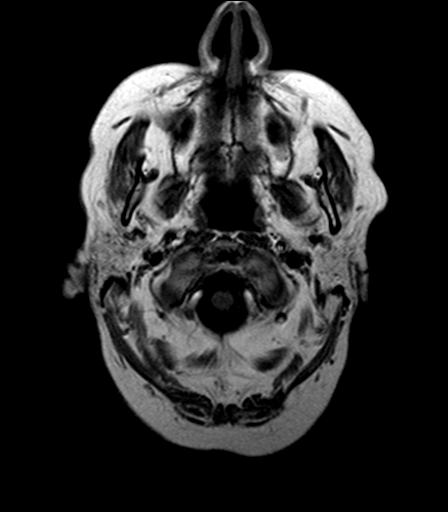
[im 6/24]
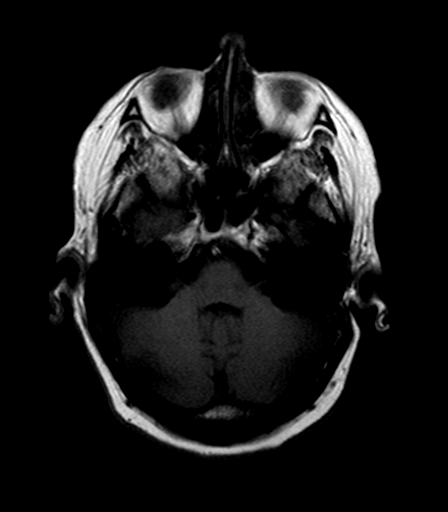
[im 12/24]
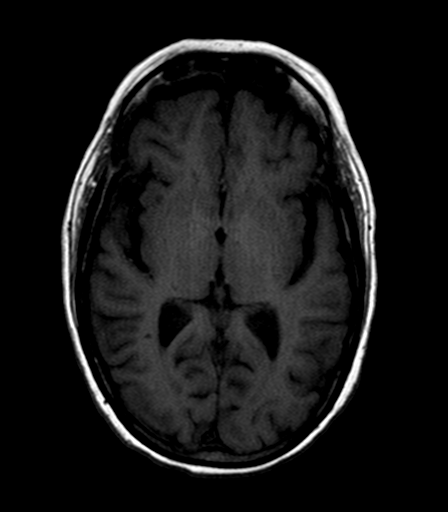
[im 18/24]
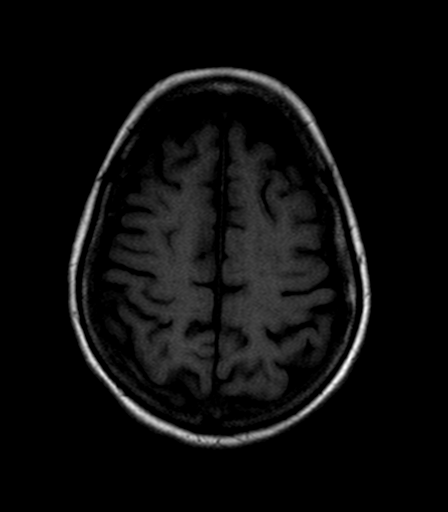
[im 24/24]
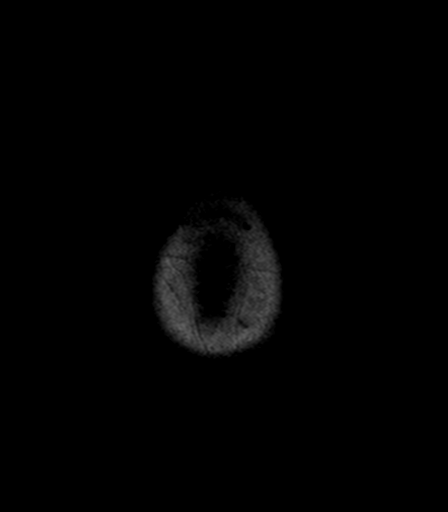

[Series 9: T2 · coronal · 5.0mm · 0.69mm/px · 6 of 26 slices shown (2 of 2)]
[im 1/26]
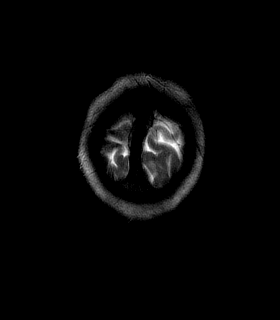
[im 6/26]
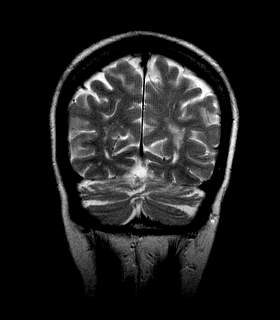
[im 11/26]
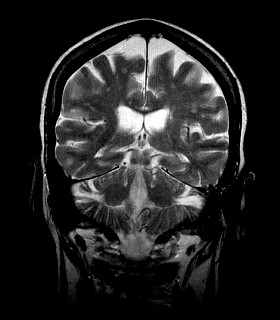
[im 16/26]
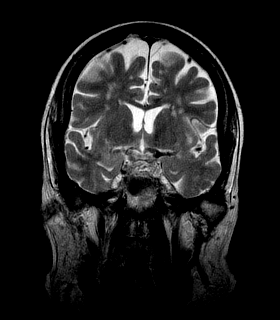
[im 21/26]
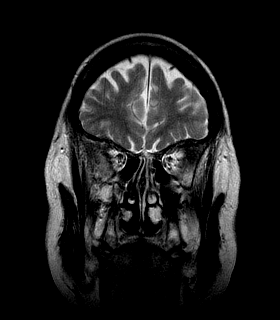
[im 26/26]
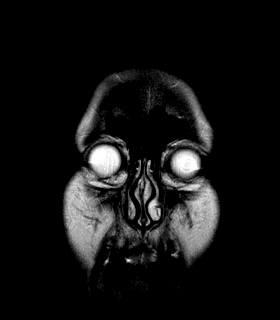

[39 of 48 positions shown; findings below may reference images not displayed]

DIAGNOSTIC STUDIES

EXAM

MRI of the brain without contrast

INDICATION

headaches
PT WOKE UP WITH SHOOTING PAIN DOWN RT ARM, NUMBNESS AND WEAKNESS JUST BEFORE THE NEW YEAR.  SOME
IMPROVEMENT WITH PT.  RG

TECHNIQUE

Sagittal axial and coronal images were obtained with variable T1 and T2 weighting.

COMPARISONS

None available

FINDINGS

No abnormal signal properties of the brainstem or visualized cervical cord are seen. The expected
signal voids are noted throughout the major intracranial vascular structures and paranasal sinuses.
Minimal fluid is noted within the left mastoid air cells.

No intracranial mass or hemorrhage is seen. No diffusion restriction is evident throughout the
brain parenchyma. Scattered areas of T2 signal seen throughout the supratentorial white matter
consistent with small vessel ischemic disease and scattered white matter infarcts. No acute ischemia
is evident.

IMPRESSION

Cortical atrophy with extensive small vessel ischemic disease as described. No intracranial mass or
hemorrhage is seen. No acute ischemia is evident.

Tech Notes:

PT WOKE UP WITH SHOOTING PAIN DOWN RT ARM, NUMBNESS AND WEAKNESS JUST BEFORE THE NEW YEAR.  SOME
IMPROVEMENT WITH PT.  RG

## 2022-01-18 IMAGING — MR C-spine^Routine
5 series · 39 of 48 positions shown · non-contrast
Comparison: none

[Series 2: T2 · sagittal · 3.0mm · 0.54mm/px · 8 of 13 slices shown]
[im 1/13]
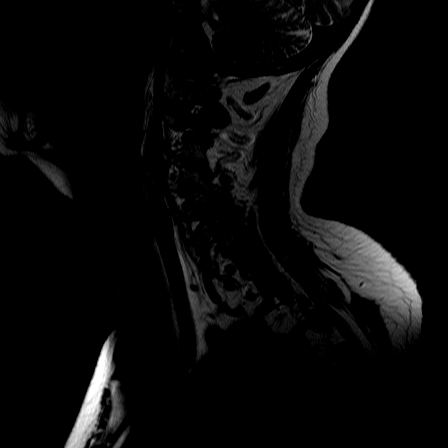
[im 2/13]
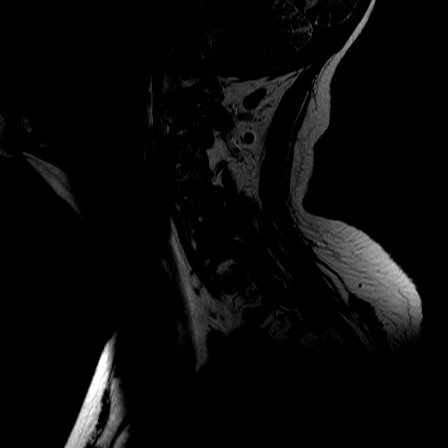
[im 4/13]
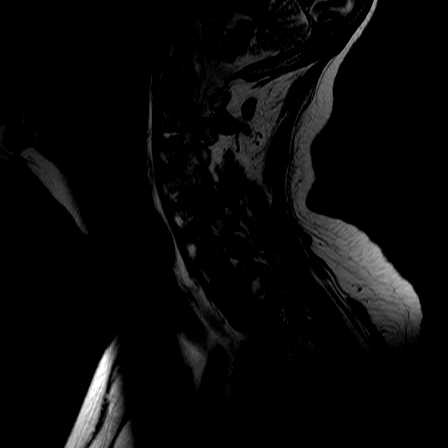
[im 6/13]
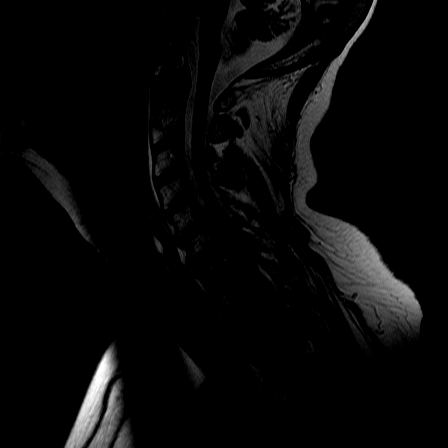
[im 7/13]
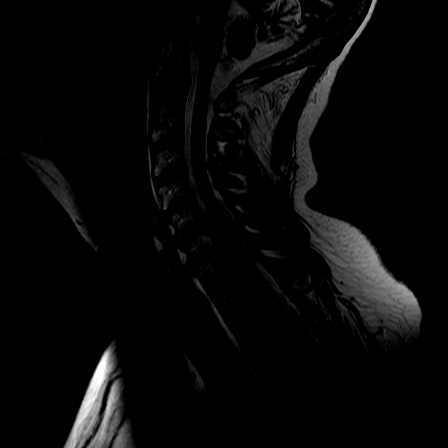
[im 9/13]
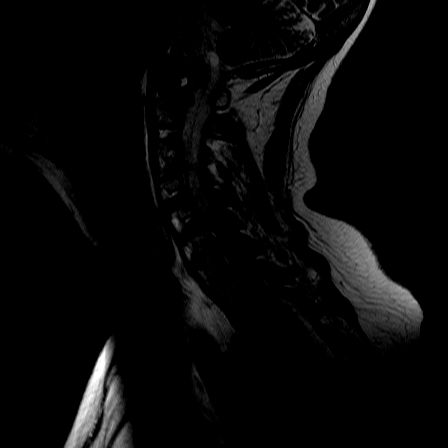
[im 11/13]
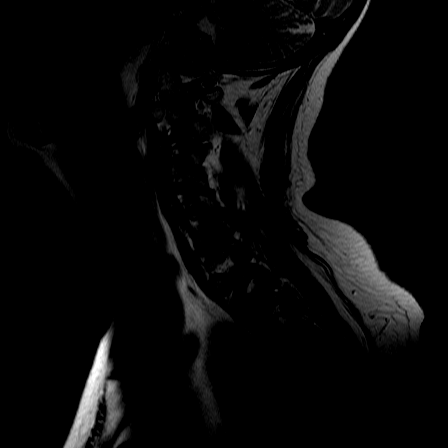
[im 13/13]
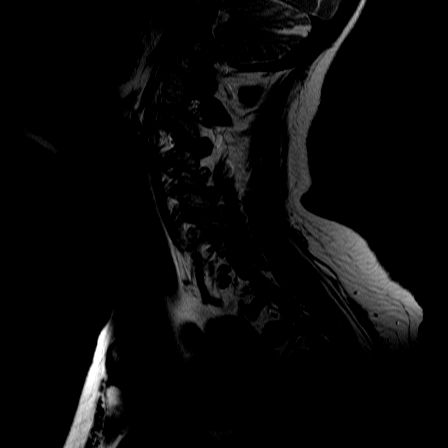

[Series 3: T1 · sagittal · 3.0mm · 0.75mm/px · 7 of 13 slices shown (1 of 2)]
[im 1/13]
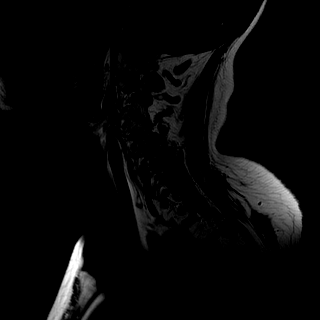
[im 3/13]
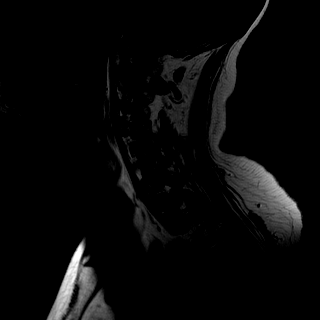
[im 5/13]
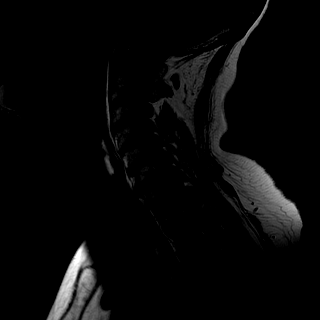
[im 7/13]
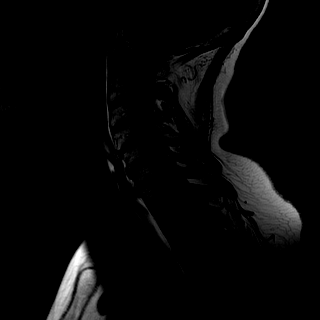
[im 9/13]
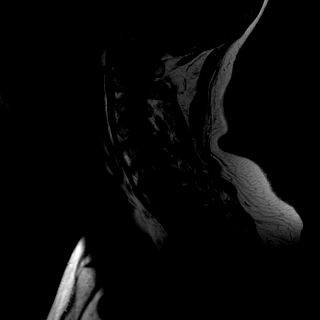
[im 11/13]
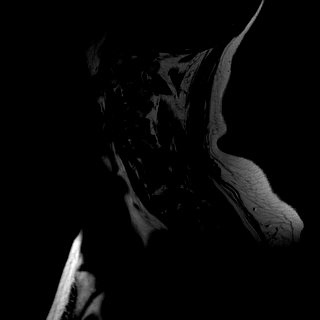
[im 13/13]
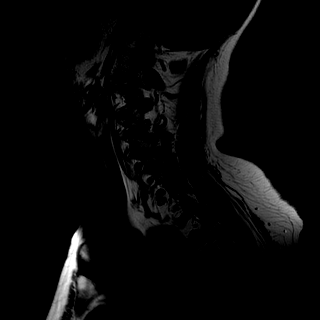

[Series 4: STIR · sagittal · 3.0mm · 0.94mm/px · 7 of 13 slices shown]
[im 1/13]
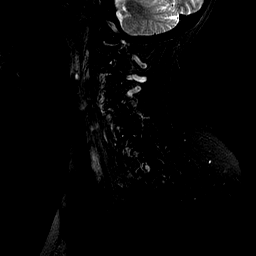
[im 3/13]
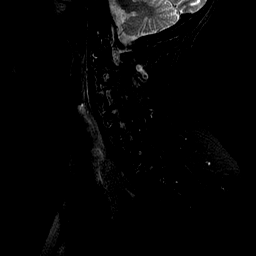
[im 5/13]
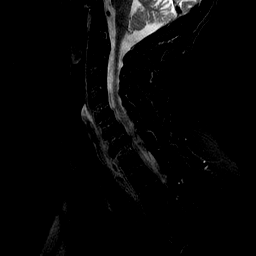
[im 7/13]
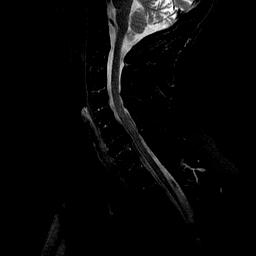
[im 9/13]
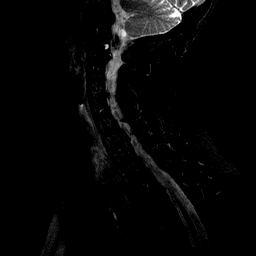
[im 11/13]
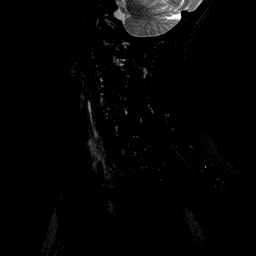
[im 13/13]
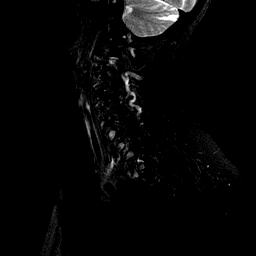

[Series 5: provider echo axial · axial · 3.0mm · 0.39mm/px · z∈[-27,+56]mm · 8 of 24 slices shown]
[im 1/24]
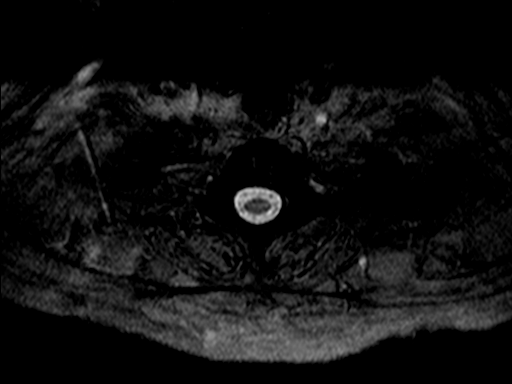
[im 4/24]
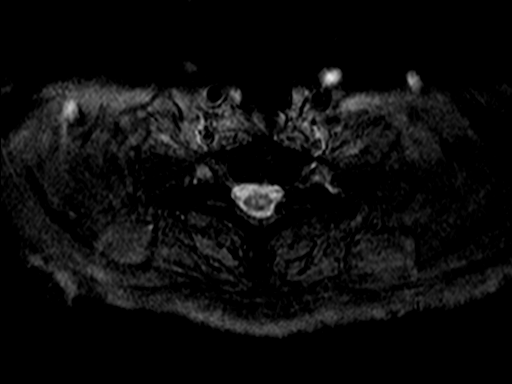
[im 8/24]
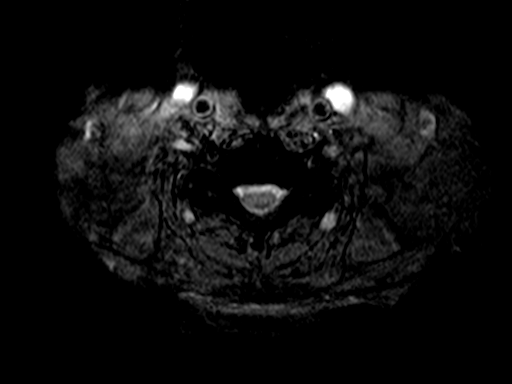
[im 10/24]
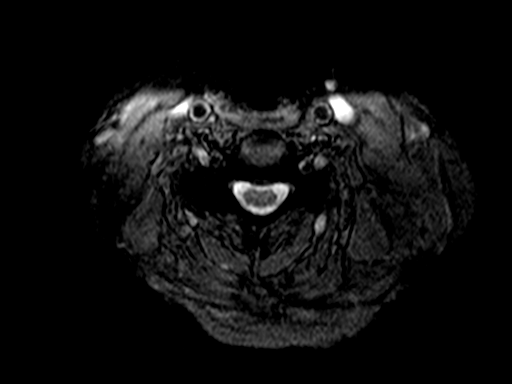
[im 14/24]
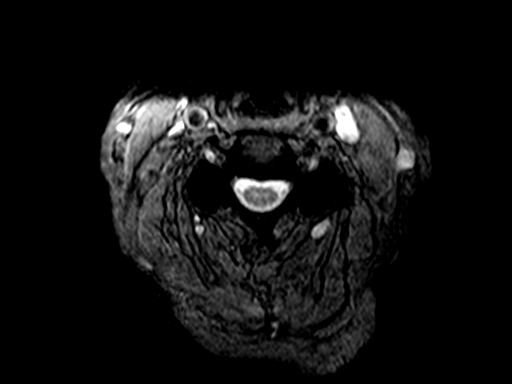
[im 16/24]
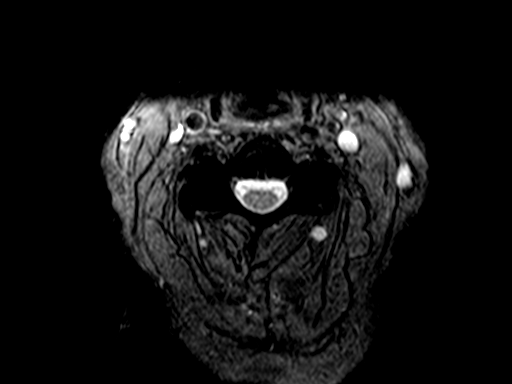
[im 20/24]
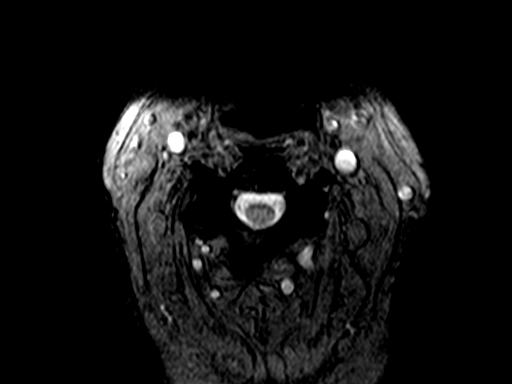
[im 24/24]
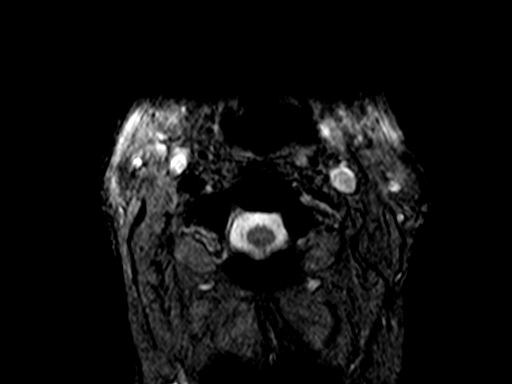

[Series 6: T1 · axial · 3.0mm · 0.78mm/px · z∈[-27,+56]mm · 9 of 24 slices shown (2 of 2)]
[im 1/24]
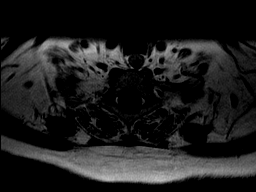
[im 4/24]
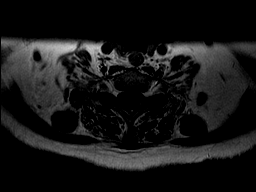
[im 8/24]
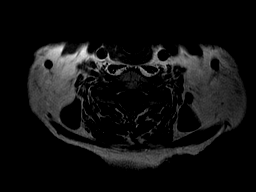
[im 10/24]
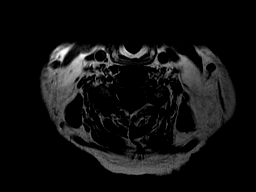
[im 12/24]
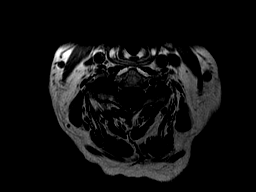
[im 14/24]
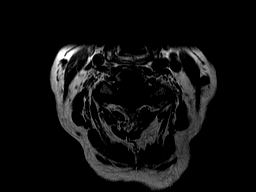
[im 16/24]
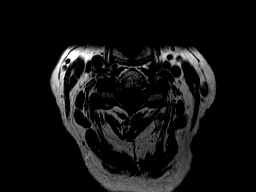
[im 20/24]
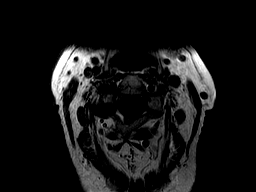
[im 24/24]
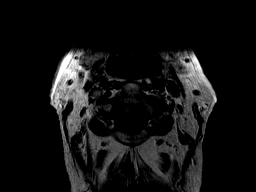

[39 of 48 positions shown; findings below may reference images not displayed]

DIAGNOSTIC STUDIES

EXAM

MRI of the cervical spine without contrast

INDICATION

neck pain with headaches
PT WOKE UP WITH SHOOTING PAIN DOWN RT ARM, NUMBNESS AND WEAKNESS JUST BEFORE THE NEW YEAR.  SOME
IMPROVEMENT WITH PT.  RG

TECHNIQUE

Sagittal axial images were obtained with variable T1 and T2 weighting.

COMPARISONS

None available

FINDINGS

No abnormal signal properties of the visualized brainstem, cervical cord, or visualized thoracic
spinal cord are seen. Mild degenerative changes are noted at C1-2. The C2-3 disc is normal

C3-4: Minimal disc osteophyte complex is noted with prominent right facet hypertrophy resulting in
mild to moderate right bony neural foraminal stenosis.

C4-5: Minimal disc osteophyte complex is seen with bilateral facet hypertrophy there is
mild-to-moderate bilateral bony neural foraminal stenosis.

C5-6: Disc osteophyte complex is seen with mild facet hypertrophy. No significant central canal or
neural foraminal stenosis is seen.

C6-7: Prominent disc osteophyte complex is seen with uncovertebral and facet hypertrophy. This
results in mild central canal narrowing. There is mild-to-moderate right and moderate to severe left
bony neural foraminal stenosis.

C7-T1: Minimal disc osteophyte complex is seen without central canal or neural foraminal stenosis.

IMPRESSION

Multilevel degenerative changes with disc osteophyte complex, facet hypertrophy, and uncovertebral
hypertrophy noted. This results in varying degrees of central canal and neural foraminal stenosis
which is greatest at C6-7. Please see above discussion for individual levels.

Tech Notes:

PT WOKE UP WITH SHOOTING PAIN DOWN RT ARM, NUMBNESS AND WEAKNESS JUST BEFORE THE NEW YEAR.  SOME
IMPROVEMENT WITH PT.  RG

## 2022-01-24 ENCOUNTER — Ambulatory Visit: Admit: 2022-01-24 | Discharge: 2022-01-24 | Payer: MEDICARE

## 2022-01-24 ENCOUNTER — Encounter: Admit: 2022-01-24 | Discharge: 2022-01-24 | Payer: MEDICARE

## 2022-01-24 DIAGNOSIS — R197 Diarrhea, unspecified: Secondary | ICD-10-CM

## 2022-01-24 MED ORDER — BARIUM SULFATE 60 % PO CREA
180 mL | Freq: Once | RECTAL | 0 refills | Status: CP
Start: 2022-01-24 — End: ?
  Administered 2022-01-24: 17:00:00 180 mL via RECTAL

## 2022-01-24 MED ORDER — BARIUM SULFATE 40 % (W/V) PO SUSP
240 mL | Freq: Once | ORAL | 0 refills | Status: CP
Start: 2022-01-24 — End: ?
  Administered 2022-01-24: 17:00:00 240 mL via ORAL

## 2022-01-24 MED ORDER — IOTHALAMATE MEGLUMINE 60 % IJ SOLN
10 mL | Freq: Once | URETHRAL | 0 refills | Status: AC
Start: 2022-01-24 — End: ?

## 2022-01-26 ENCOUNTER — Encounter: Admit: 2022-01-26 | Discharge: 2022-01-26 | Payer: MEDICARE

## 2022-01-26 DIAGNOSIS — R933 Abnormal findings on diagnostic imaging of other parts of digestive tract: Secondary | ICD-10-CM

## 2022-03-07 ENCOUNTER — Encounter: Admit: 2022-03-07 | Discharge: 2022-03-07 | Payer: MEDICARE

## 2022-03-07 MED ORDER — PRAVASTATIN 10 MG PO TAB
ORAL_TABLET | 0 refills
Start: 2022-03-07 — End: ?

## 2022-03-14 ENCOUNTER — Ambulatory Visit: Admit: 2022-03-14 | Discharge: 2022-03-15 | Payer: MEDICARE

## 2022-03-14 ENCOUNTER — Encounter: Admit: 2022-03-14 | Discharge: 2022-03-14 | Payer: MEDICARE

## 2022-03-14 DIAGNOSIS — M8580 Other specified disorders of bone density and structure, unspecified site: Secondary | ICD-10-CM

## 2022-03-14 DIAGNOSIS — I10 Essential (primary) hypertension: Secondary | ICD-10-CM

## 2022-03-14 DIAGNOSIS — R002 Palpitations: Secondary | ICD-10-CM

## 2022-03-14 DIAGNOSIS — I499 Cardiac arrhythmia, unspecified: Secondary | ICD-10-CM

## 2022-03-14 DIAGNOSIS — E669 Obesity, unspecified: Secondary | ICD-10-CM

## 2022-03-14 DIAGNOSIS — M1990 Unspecified osteoarthritis, unspecified site: Secondary | ICD-10-CM

## 2022-03-14 DIAGNOSIS — E785 Hyperlipidemia, unspecified: Secondary | ICD-10-CM

## 2022-03-14 NOTE — Progress Notes
Date of Service: 03/14/2022    Shari Spence is a 86 y.o. female.       HPI     Shari Spence is an 86 year old female who initially started following after being diagnosed with mitral valve prolapse.  She had had a follow-up echocardiogram which is now about 10 years out back in 2011.  Her left ventricular systolic function was 65% she had no significant valvular disease and no evidence of mitral valve prolapse at that time.  From a cardiovascular standpoint she has been stable and we have followed her intermittently for hypertension, palpitations and dyslipidemia.  Overall from a cardiovascular standpoint she has been doing well.  Her blood pressure is little elevated in the office today however she states it typically is under better control.  She did describe recently some right arm weakness.  She has been told it was likely a pinched nerve in his going through rehabilitation however she continues to have right-sided arm weakness.  She stated that she was evaluated for the possibility of a neurologic event however there was no evidence of a stroke or any white matter disease per her report.  She continues to have some weakness in her right dominant arm.  There is no other neurologic deficit noted.  Additionally, she has some residual numbness on her right forearm.  Additionally, during her recent work-up she had a 48-hour Holter monitor and had a 4 beat episode of ventricular ectopy.  She has not had any symptoms necessarily but I would like to go ahead and repeat her echocardiogram.         Vitals:    03/14/22 0817   BP: (!) 156/70   BP Source: Arm, Right Upper   Pulse: 61   PainSc: Zero   Weight: 75.8 kg (167 lb)   Height: 172.7 cm (5' 8)     Body mass index is 25.39 kg/m?Marland Kitchen     Past Medical History  Patient Active Problem List    Diagnosis Date Noted   ? Mass of left upper extremity 10/28/2020   ? Melena 10/12/2018   ? Overweight (BMI 25.0-29.9) 09/02/2011   ? Hyperlipemia 05/07/2009     on statin therapy and LDL is not at goal     ? Palpitations 05/06/2009     been experiencing heart palpitations for more than 30 years     ? HTN (hypertension) 05/06/2009     Borderline hypertension - fairly well controlled only with diuretics     ? Mitral valve prolapse 05/06/2009     Pt had Echo 4/08 demonstrated normal left ventricular systolic function, EF 50%, no valvular abnormalities, no evidence of mitral valve prolapse     ? Arthritis 05/06/2009   ? Osteopenia 04/09/2007         Review of Systems   Constitutional: Negative.   HENT: Negative.    Eyes: Negative.    Cardiovascular: Positive for irregular heartbeat.   Respiratory: Negative.    Endocrine: Negative.    Hematologic/Lymphatic: Negative.    Skin: Negative.    Musculoskeletal: Negative.    Gastrointestinal: Negative.    Genitourinary: Negative.    Neurological: Negative.    Psychiatric/Behavioral: Negative.    All other systems reviewed and are negative.      Physical Exam  Physical Exam   General Appearance: alert and oriented, no acute distress  Skin: warm, moist, no ulcers  Head: normocephalic, symmetric  Eyes: EOMI, PERRL, sclera are clear and without icterus  ENT: unremarkable,  nares patent  Neck Veins: neck veins are flat, neck veins are not distended  Carotid Arteries: normal carotid upstroke bilaterally, no bruits  Chest Inspection: chest is normal in appearance  Auscultation/Percussion: lungs clear to auscultation, no rales, rhonchi, wheezes or friction rub appreciated  Cardiac Rhythm: regular rhythm and normal rate  Cardiac Auscultation: Normal S1 & S2, no S3 or S4, no rub - normal pmi  Murmurs: no cardiac murmurs   Extremities: no lower extremity edema bilaterally; 2+ symmetric distal pulses  Muskuloskeletal: no obvious deformity  Neurologic Exam: neurological assessment grossly intact  Mood and Affect: Appropriate      Cardiovascular Health Factors  Vitals BP Readings from Last 3 Encounters:   03/14/22 (!) 156/70   12/29/21 (!) 184/62   06/21/21 (!) 140/80 Wt Readings from Last 3 Encounters:   03/14/22 75.8 kg (167 lb)   12/29/21 71.7 kg (158 lb)   06/21/21 75.8 kg (167 lb 1.6 oz)     BMI Readings from Last 3 Encounters:   03/14/22 25.39 kg/m?   12/29/21 24.03 kg/m?   06/21/21 25.41 kg/m?      Smoking Social History     Tobacco Use   Smoking Status Never   Smokeless Tobacco Never      Lipid Profile Cholesterol   Date Value Ref Range Status   05/17/2021 168  Final     HDL   Date Value Ref Range Status   05/17/2021 62  Final     LDL   Date Value Ref Range Status   05/17/2021 88  Final     Triglycerides   Date Value Ref Range Status   05/17/2021 90  Final      Blood Sugar Hemoglobin A1C   Date Value Ref Range Status   05/27/2019 5.2  Final     Glucose   Date Value Ref Range Status   09/09/2020 83 70 - 100 MG/DL Final   16/09/9603 540  Final   05/27/2019 95  Final          Problems Addressed Today  Encounter Diagnoses   Name Primary?   ? Mixed hyperlipidemia Yes   ? Palpitations    ? Essential hypertension    ? Cardiovascular symptoms    ? Primary hypertension        Assessment and Plan     1. Salvo of ventricular ectopy-I would like to go ahead and get an echocardiogram to assess cardiac structure and function.  We may need to discuss initiating a cardioselective beta-blocker if she does have symptomatic palpitations however overall she is doing well and there is no clear indication to initiate a new medication.  We will look for the results of her echocardiogram.  2. Right arm weakness with some paresthesias in the right forearm-she has been told that this is due to a pinched nerve.  If she is having radicular symptoms from nerve impingement she may benefit from being evaluated and seen in the spine center.  Given that her right arm is dominant and she continues to have weakness like to see if there are any other options or work-up that are needed.  3. Hypertension-we will plan to have her check her blood pressures at home.  She has only been on losartan 50 mg daily in addition to hydrochlorothiazide.  She states her blood pressures are great at home so not can make an adjustment however we could increase either the hydrochlorothiazide or the losartan if necessary.  4. Dyslipidemia-she is currently  on pravastatin in addition to Zetia.  Her goal is to maintain an LDL cholesterol of less than 100 if possible.    We will see her back in 6 months and I will look for her upcoming echocardiogram.         Current Medications (including today's revisions)  ? acetaminophen SR (TYLENOL ARTHRITIS PAIN) 650 mg tablet Take one tablet by mouth every 6 hours as needed for Pain.   ? albuterol sulfate (PROAIR HFA) 90 mcg/actuation HFA aerosol inhaler INHALE 2 PUFFS BY MOUTH EVERY 4 TO 6 HOURS AS NEEDED FOR SHORTNESS OF BREATH FOR WHEEZING   ? brimonidine (ALPHAGAN P) 0.15 % ophthalmic solution Apply one drop to both eyes three times daily.   ? calcium carbonate/vitamin D3 (CALCIUM 600 + D PO) Take 2 tablets by mouth daily.   ? cholecalciferol (VITAMIN D-3) 5000 unit tablet Take one tablet by mouth daily.   ? CoQ10 (Ubiquinol) 200 mg cap Take one capsule by mouth daily.   ? cyanocobalamin (vitamin B-12) 5,000 mcg subl Place one tablet under tongue daily.   ? Diphenhydramine-Acetaminophen 25-500 mg tab tablet Take 1 tablet by mouth at bedtime daily.   ? docusate (COLACE) 100 mg capsule Take one capsule by mouth daily.   ? dorzolamide-timolol (PF) 2-0.5 % drop Place one drop into or around eye(s) twice daily.   ? duloxetine DR (CYMBALTA) 30 mg capsule Take one capsule by mouth daily.   ? ezetimibe (ZETIA) 10 mg tablet Take one tablet by mouth daily.   ? Folic Acid 800 mcg tab Take one tablet by mouth daily.   ? hydroCHLOROthiazide (HYDRODIURIL) 12.5 mg tablet Take one tablet by mouth every morning.   ? hydrocortisone 2.5 % topical cream Apply  topically to affected area twice daily.   ? latanoprost (XALATAN) 0.005 % ophthalmic solution Place one drop into or around eye(s) at bedtime daily.   ? lipase-protease-amylase (ZENPEP 10) 10,000-32,000 -42,000 unit capsule Take  by mouth twice daily with meals.   ? losartan (COZAAR) 50 mg tablet Take one tablet by mouth daily.   ? melatonin 10 mg tab Take one tablet by mouth at bedtime daily.   ? omega-3 fatty acids-vitamin E 1,000 mg cap Take one capsule by mouth daily.   ? other medication Glucomasine/Chondroitin 1500/200: take 2 tablets by mouth daily   ? Potassium 99 mg tab Take one tablet by mouth daily.   ? pravastatin (PRAVACHOL) 10 mg tablet TAKE 1 TABLET BY MOUTH ONCE DAILY AT BEDTIME   ? tacrolimus(+) (PROTOPIC) 0.1 % topical ointment Apply  topically to affected area twice daily.   ? vitamins, multi w/minerals 9 mg iron-400 mcg tab Take one tablet by mouth daily.

## 2022-03-15 DIAGNOSIS — E782 Mixed hyperlipidemia: Secondary | ICD-10-CM

## 2022-03-15 DIAGNOSIS — R002 Palpitations: Secondary | ICD-10-CM

## 2022-03-15 DIAGNOSIS — R0989 Other specified symptoms and signs involving the circulatory and respiratory systems: Secondary | ICD-10-CM

## 2022-03-15 DIAGNOSIS — I10 Essential (primary) hypertension: Secondary | ICD-10-CM

## 2022-03-18 ENCOUNTER — Encounter: Admit: 2022-03-18 | Discharge: 2022-03-18 | Payer: MEDICARE

## 2022-03-18 NOTE — Telephone Encounter
-----   Message from Kerman Passey, MD sent at 03/18/2022  2:56 PM CDT -----  Schedule rectal EUS for abnormal ARM and diarrhea.  ----- Message -----  From: Noemi Chapel, RN  Sent: 01/17/2022  10:51 AM CDT  To: Kerman Passey, MD

## 2022-03-18 NOTE — Telephone Encounter
Message sent to Dr. Nani Skillern team to help assist in scheduling patient for rectal EUS.   Mcyhart message sent to pt.

## 2022-03-21 ENCOUNTER — Encounter: Admit: 2022-03-21 | Discharge: 2022-03-21 | Payer: MEDICARE

## 2022-03-21 DIAGNOSIS — R197 Diarrhea, unspecified: Secondary | ICD-10-CM

## 2022-03-21 DIAGNOSIS — K6289 Other specified diseases of anus and rectum: Secondary | ICD-10-CM

## 2022-03-30 ENCOUNTER — Ambulatory Visit: Admit: 2022-03-30 | Discharge: 2022-03-30 | Payer: MEDICARE

## 2022-03-30 DIAGNOSIS — R197 Diarrhea, unspecified: Secondary | ICD-10-CM

## 2022-03-30 DIAGNOSIS — K6289 Other specified diseases of anus and rectum: Secondary | ICD-10-CM

## 2022-04-13 ENCOUNTER — Encounter: Admit: 2022-04-13 | Discharge: 2022-04-13 | Payer: MEDICARE

## 2022-04-13 ENCOUNTER — Ambulatory Visit: Admit: 2022-04-13 | Discharge: 2022-04-13 | Payer: MEDICARE

## 2022-04-13 DIAGNOSIS — E782 Mixed hyperlipidemia: Secondary | ICD-10-CM

## 2022-04-13 DIAGNOSIS — R002 Palpitations: Secondary | ICD-10-CM

## 2022-04-13 DIAGNOSIS — I10 Essential (primary) hypertension: Secondary | ICD-10-CM

## 2022-04-13 DIAGNOSIS — R0989 Other specified symptoms and signs involving the circulatory and respiratory systems: Secondary | ICD-10-CM

## 2022-04-26 NOTE — Progress Notes
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 very pleasant female with pmhx of HTN, HLD, and alopecia, who was referred by her PCP, Dr. Lona Kettle, for evaluation and treatment of COPD.     Pt shares that she has no prior pulmonary diagnoses, hospitalizations, or frequent respiratory infections. In the fall of 2022, she notes that she had severe chest congestion, cough, shortness of breath after cleaning up leaves outside. She was told she had pneumonia and was treated with a steroid burst, albuterol inhaler and Zpak. She notes that it took her some time to recover from this, but never used the inhaler she was prescribed because she didn't feel she needed it. 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 She relays that she has a robust family history of COPD and notes many relatives have died from COPD, so this was particularly concerning to her and thus she was referred to [facility] for further evaluation.     She notes that she is a never smoker, but was exposed to significant second-hand smoke as a child. 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 She reports that she works close to 10,000 steps per day and is able to complete all ADLs and IADLs independently. She is active doing chores and helping with yard work and does not feel that shortness of breath limits her function on a day to day basis. She notes very mild shortness of breath with exertion, which she relates to normal aging. She denies fever, cough, lower extremity edema, chest pain.          Past Medical History:  Medical History:   Diagnosis Date   ? Arrhythmia     past hx per patient.   ? Arthritis    ? COPD (chronic obstructive pulmonary disease) (HCC) ? HTN (hypertension)    ? Hyperlipemia    ? Obesity    ? Osteopenia    ? Palpitations        Past Surgical History:  Surgical History:   Procedure Laterality Date   ? HX JOINT REPLACEMENT Right 11/2015    right hip    ? ESOPHAGOGASTRODUODENOSCOPY WITH BIOPSY - FLEXIBLE N/A 10/15/2018    Performed by Virgina Organ, MD at [facility] ENDO   ? ESOPHAGOGASTRODUODENOSCOPY WITH BIOPSY - FLEXIBLE N/A 01/16/2019    Performed by Normajean Baxter, MD at [facility] ENDO   ? Left forearm dorsal mass excision Left 10/16/2020    Performed by Letitia Neri, MD at [facility] OR   ? ANORECTAL MANOMETRY N/A 01/13/2022    Performed by Jolee Ewing, MD at [facility] ENDO   ? HX APPENDECTOMY     ? KNEE SURGERY         Family History:  Family History   Problem Relation Age of Onset   ? COPD Mother    ? Hypertension Sister        Social History:  Social History     Socioeconomic History   ? Marital status: Married     Spouse name: Teresita Madura. Frederica Kuster   ? Number of children: 4   ? Years of education: 55   Tobacco Use   ?  Smoking status: Never   ? Smokeless tobacco: Never   Substance and Sexual Activity   ? Alcohol use: Yes     Comment: rarely   ? Drug use: No   ? Sexual activity: Not Currently       Medications:  ? acetaminophen SR (TYLENOL ARTHRITIS PAIN) 650 mg tablet Take one tablet by mouth every 6 hours as needed for Pain.   ? albuterol sulfate (PROAIR HFA) 90 mcg/actuation HFA aerosol inhaler INHALE 2 PUFFS BY MOUTH EVERY 4 TO 6 HOURS AS NEEDED FOR SHORTNESS OF BREATH FOR WHEEZING   ? brimonidine (ALPHAGAN P) 0.15 % ophthalmic solution Apply one drop to both eyes three times daily.   ? calcium carbonate/vitamin D3 (CALCIUM 600 + D PO) Take 2 tablets by mouth daily.   ? cholecalciferol (VITAMIN D-3) 5000 unit tablet Take one tablet by mouth daily.   ? CoQ10 (Ubiquinol) 200 mg cap Take one capsule by mouth daily.   ? cyanocobalamin (vitamin B-12) 5,000 mcg subl Place one tablet under tongue daily.   ? Diphenhydramine-Acetaminophen 25-500 mg tab tablet Take 1 tablet by mouth at bedtime daily.   ? docusate (COLACE) 100 mg capsule Take one capsule by mouth daily.   ? dorzolamide-timolol (PF) 2-0.5 % drop Place one drop into or around eye(s) twice daily.   ? duloxetine DR (CYMBALTA) 30 mg capsule Take one capsule by mouth daily.   ? ezetimibe (ZETIA) 10 mg tablet Take one tablet by mouth daily.   ? Folic Acid 800 mcg tab Take one tablet by mouth daily.   ? hydroCHLOROthiazide (HYDRODIURIL) 12.5 mg tablet Take one tablet by mouth every morning.   ? hydrocortisone 2.5 % topical cream Apply  topically to affected area twice daily.   ? latanoprost (XALATAN) 0.005 % ophthalmic solution Place one drop into or around eye(s) at bedtime daily.   ? lipase-protease-amylase (ZENPEP 10) 10,000-32,000 -42,000 unit capsule Take  by mouth twice daily with meals.   ? losartan (COZAAR) 50 mg tablet Take one tablet by mouth daily.   ? melatonin 10 mg tab Take one tablet by mouth at bedtime daily.   ? omega-3 fatty acids-vitamin E 1,000 mg cap Take one capsule by mouth daily.   ? other medication Glucomasine/Chondroitin 1500/200: take 2 tablets by mouth daily   ? Potassium 99 mg tab Take one tablet by mouth daily.   ? pravastatin (PRAVACHOL) 10 mg tablet TAKE 1 TABLET BY MOUTH ONCE DAILY AT BEDTIME   ? tacrolimus(+) (PROTOPIC) 0.1 % topical ointment Apply  topically to affected area twice daily.   ? vitamins, multi w/minerals 9 mg iron-400 mcg tab Take one tablet by mouth daily.       Allergies:  Allergies   Allergen Reactions   ? Epinephrine SEE COMMENTS     Irregular heart rate.        ROS:  Review of Systems   Constitutional: Negative for activity change, chills, fatigue and fever.   HENT: Negative for postnasal drip and trouble swallowing.    Respiratory: Negative for cough, chest tightness, shortness of breath and wheezing.    Cardiovascular: Negative for chest pain, palpitations and leg swelling.   All other systems reviewed and are negative.      Vitals & Physical Exam: Vitals:    04/27/22 1413   BP: (!) 152/57   BP Source: Arm, Right Upper   Pulse: 54   Temp: 36.2 ?C (97.2 ?F)   SpO2: 99%   TempSrc: Oral  PainSc: Zero   Weight: 73.9 kg (163 lb)   Height: 172.7 cm (5' 8)     Body mass index is 24.78 kg/m?Marland Kitchen     Physical Exam  Vitals reviewed.   Constitutional:       General: She is not in acute distress.     Appearance: Normal appearance. She is not toxic-appearing.   HENT:      Head: Normocephalic.      Nose: Nose normal. No congestion.      Mouth/Throat:      Mouth: Mucous membranes are moist.      Pharynx: No oropharyngeal exudate.   Eyes:      Extraocular Movements: Extraocular movements intact.   Cardiovascular:      Rate and Rhythm: Normal rate and regular rhythm.      Pulses: Normal pulses.   Pulmonary:      Effort: Pulmonary effort is normal. No respiratory distress.      Breath sounds: Normal breath sounds. No wheezing or rales.   Musculoskeletal:      Cervical back: Normal range of motion.   Lymphadenopathy:      Cervical: No cervical adenopathy.   Skin:     General: Skin is warm and dry.   Neurological:      Mental Status: She is alert.       Labs:  Reviewed in Epic    Imaging:  CXR 04/27/2022 with left suprahilar nodule and no prior imaging to review    PFTs:   04/27/2022   FVC 2.22L, 82%   FEV1 1.63L, 80%   Ratio 73% (LLN 61%)   RV 3.14L, 121%   TLC 5.67 L, 104%   DLCO 95% (unc)   My interpretation: normal spirometry without bronchodilator response, mild air trapping and normal diffusion capacity uncorrected for hemoglobin    Assessment  & Plan:  Shari Spence is an 86 y.o. very pleasant female with pmhx of HTN, HLD, and alopecia, who was referred by her PCP, Dr. Lona Kettle, for evaluation and treatment of COPD. Complete PFTs obtained today are essentially normal and pt has no symptoms or history consistent with chronic bronchitis nor is her function limited by pulmonary complaints. Discussed that based on our testing today, she does not have COPD and therefore does not need maintenance or rescue inhalers. CXR was obtained today and did note a few small pulmonary nodules. We discussed that given that she is a never smoker and is asymptomatic at her age, it would be reasonable to forego further work-up, but pt is interested in obtaining a CT chest for further delineation of the nodules, which is not unreasonable. I will call her with the results of the CT scan and she can follow-up as needed following this. Pt is up to date on recommended vacines, including pneumonia series, covid series and annual influneza vaccine.         RTC PRN. Dicsussed & evaluated with Dr. Otilio Miu.    Anola Gurney, MD, PGY5  Pulmonary & Critical Care Fellow

## 2022-04-27 ENCOUNTER — Ambulatory Visit: Admit: 2022-04-27 | Discharge: 2022-04-27 | Payer: MEDICARE

## 2022-04-27 ENCOUNTER — Encounter: Admit: 2022-04-27 | Discharge: 2022-04-27 | Payer: MEDICARE

## 2022-04-27 DIAGNOSIS — E669 Obesity, unspecified: Secondary | ICD-10-CM

## 2022-04-27 DIAGNOSIS — M8580 Other specified disorders of bone density and structure, unspecified site: Secondary | ICD-10-CM

## 2022-04-27 DIAGNOSIS — R002 Palpitations: Secondary | ICD-10-CM

## 2022-04-27 DIAGNOSIS — J449 Chronic obstructive pulmonary disease, unspecified: Secondary | ICD-10-CM

## 2022-04-27 DIAGNOSIS — R911 Solitary pulmonary nodule: Secondary | ICD-10-CM

## 2022-04-27 DIAGNOSIS — I10 Essential (primary) hypertension: Secondary | ICD-10-CM

## 2022-04-27 DIAGNOSIS — E785 Hyperlipidemia, unspecified: Secondary | ICD-10-CM

## 2022-04-27 DIAGNOSIS — I499 Cardiac arrhythmia, unspecified: Secondary | ICD-10-CM

## 2022-04-27 DIAGNOSIS — M1990 Unspecified osteoarthritis, unspecified site: Secondary | ICD-10-CM

## 2022-04-30 ENCOUNTER — Encounter: Admit: 2022-04-30 | Discharge: 2022-04-30 | Payer: MEDICARE

## 2022-04-30 DIAGNOSIS — M8580 Other specified disorders of bone density and structure, unspecified site: Secondary | ICD-10-CM

## 2022-04-30 DIAGNOSIS — E785 Hyperlipidemia, unspecified: Secondary | ICD-10-CM

## 2022-04-30 DIAGNOSIS — R002 Palpitations: Secondary | ICD-10-CM

## 2022-04-30 DIAGNOSIS — M1990 Unspecified osteoarthritis, unspecified site: Secondary | ICD-10-CM

## 2022-04-30 DIAGNOSIS — E669 Obesity, unspecified: Secondary | ICD-10-CM

## 2022-04-30 DIAGNOSIS — I10 Essential (primary) hypertension: Secondary | ICD-10-CM

## 2022-04-30 DIAGNOSIS — J449 Chronic obstructive pulmonary disease, unspecified: Secondary | ICD-10-CM

## 2022-04-30 DIAGNOSIS — I499 Cardiac arrhythmia, unspecified: Secondary | ICD-10-CM

## 2022-05-26 ENCOUNTER — Encounter: Admit: 2022-05-26 | Discharge: 2022-05-26 | Payer: MEDICARE

## 2022-05-26 NOTE — Telephone Encounter
Received VM from pt pt stating that she has a question about a procedure that Dr. Renea Ee wants her to have and requested a call back. RN called to follow up.     Pt wants to know what the rationale behind having the EUS is and what they would be looking for. Informed her that they would want to possibly look at the sphincters d/t her having diarrhea.     She states that she is not sure if the procedure is worth it as she has to go through a lot of prep and she is not sure if they find something that they would be able to fix it. RN informed her I would follow up with Dr. Renea Ee and let her know what he says.     Routing to Dr. Renea Ee for follow up.

## 2022-06-01 ENCOUNTER — Encounter: Admit: 2022-06-01 | Discharge: 2022-06-01 | Payer: MEDICARE

## 2022-06-01 MED ORDER — PRAVASTATIN 10 MG PO TAB
ORAL_TABLET | ORAL | 0 refills | 90.00000 days | Status: AC
Start: 2022-06-01 — End: ?

## 2022-06-09 ENCOUNTER — Encounter: Admit: 2022-06-09 | Discharge: 2022-06-09 | Payer: MEDICARE

## 2022-07-04 ENCOUNTER — Encounter: Admit: 2022-07-04 | Discharge: 2022-07-04 | Payer: MEDICARE

## 2022-07-11 ENCOUNTER — Encounter: Admit: 2022-07-11 | Discharge: 2022-07-11 | Payer: MEDICARE

## 2022-07-13 ENCOUNTER — Encounter: Admit: 2022-07-13 | Discharge: 2022-07-13 | Payer: MEDICARE

## 2022-07-13 ENCOUNTER — Ambulatory Visit: Admit: 2022-07-13 | Discharge: 2022-07-14 | Payer: MEDICARE

## 2022-07-13 DIAGNOSIS — E785 Hyperlipidemia, unspecified: Secondary | ICD-10-CM

## 2022-07-13 DIAGNOSIS — I10 Essential (primary) hypertension: Secondary | ICD-10-CM

## 2022-07-13 DIAGNOSIS — M8580 Other specified disorders of bone density and structure, unspecified site: Secondary | ICD-10-CM

## 2022-07-13 DIAGNOSIS — J449 Chronic obstructive pulmonary disease, unspecified: Secondary | ICD-10-CM

## 2022-07-13 DIAGNOSIS — R002 Palpitations: Secondary | ICD-10-CM

## 2022-07-13 DIAGNOSIS — M1990 Unspecified osteoarthritis, unspecified site: Secondary | ICD-10-CM

## 2022-07-13 DIAGNOSIS — E669 Obesity, unspecified: Secondary | ICD-10-CM

## 2022-07-13 DIAGNOSIS — I499 Cardiac arrhythmia, unspecified: Secondary | ICD-10-CM

## 2022-07-13 MED ORDER — PEG 3350-ELECTROLYTES 236-22.74-6.74 -5.86 GRAM PO SOLR
0 refills | 1.00000 days | Status: AC
Start: 2022-07-13 — End: ?

## 2022-07-13 NOTE — Patient Instructions
Please proceed with the following:    1.) Lower EUS. You will be contacted to schedule this. If you don't hear from them, please reach out to [phone]-    2.) No follow up in GI Clinic needed. Follow-up with Urology/Gynecology.    For any questions or concerns, please contact Dr. Norval Morton nurse Raynelle Fanning through MyChart or at [phone].    =============================================    General Instructions:  As part of the CARES act, starting March 26, 2020 some results are released to you automatically. Your provider will continue to send you a detailed result note on any labs that they order, but with these changes you may see your results before they do. Critical lab results will be addressed immediately, but otherwise please give your provider 72 hours (3 business days) to view and respond to your results before reaching out with any questions. Depending on your questions, they may ask you to schedule a telehealth or telephone visit to discuss further. This visit may be billed to your insurance depending on time and complexity    To have a medication refilled:   Please use the MyChart Refill request or contact your pharmacy directly to request medication refills. Please allow 72 hours.    Lab:  Medical Office Building is on the 1st floor. (Monday, 6:30 am - 7:00 pm / Tuesday-Friday, 7:00 am - 6:00 pm / Saturday, 7 am - noon)  [facility] is located on the 2nd floor. (Monday-Friday 8:00 am - 5:00 pm)  [facility] location lab is located next to the check out desk. (Monday-Friday 8:00 am - 4:45 pm)    Radiology:   On the 2nd floor of the Medical Office Building and the 2nd Floor of [facility].  Radiology Scheduling can be reached at [phone].    To Schedule office visits:  Call [phone] Option 1.    Procedure scheduling and questions:  [employer] - [phone], option 2   [place] - [phone]  [facility] - [phone]    To receive appointment reminders on your cell phone: Communication preferences can be managed in MyChart to ensure you receive important appointment notifications    Support for many chronic illnesses is available through [facility]: [email or web address] or [phone].    For urgent questions on nights, weekends or holidays, call the Operator at [phone], and ask for the doctor on call for Gastroenterology. Call 911 for any emergencies.    ============================      LOWER EUS (ENDOSCOPIC ULTRASOUND)     A lower endoscopic ultrasound a procedure that examines not only the lining, but the thickness of the entire wall of the rectum or colon.    A thin flexible ultrasound endoscope is passed through the rectum to the area that the provider wishes to examine. Once the scope is in place the attachment at the tip of the scope, which has ultrasound capability, is turned on. This area is then examined closely by using sound waves.   The test usually takes 30 - 45 minutes.            If biopsies are obtained during the procedure, you will be notified of the results in 10 days with a letter.  We will sedate you for the test by giving you medicines through an IV that will help you to relax and sleep.  We will keep you after the exam for 30 minutes.  The provider will speak to you before and after the procedure.  Unless you request otherwise, we will invite your driver to the recovery room to hear the provider?s post-exam report.  The rectum and lower 1/3 of the colon must be completely clean for the procedure to be both accurate and comprehensive. Please follow the preparation instructions carefully.     LOWER EUS (ENDOSCOPIC ULTRASOUND)        Please follow Lower EUS prep instructions.     5 Days Prior:  Check with your prescribing physician for instructions about stopping your blood thinner.  Examples of blood thinners are Aleve, Aspirin, Coumadin, Eliquis, Ibuprofen, Naproxen, Plavix, and Xarelto.  Do not give yourself a Lovenox injection the morning of the test. Lovenox injections may be taken as usual through the day before your test.  Discuss diabetic medications and insulin with the prescribing physician.     Day Prior:  Beginning in the morning, start a clear liquid diet. Drink a generous amount of clear liquids throughout the day, no alcohol. No solid or creamed/pureed food. If you are on a fluid restriction, drink the recommended amount of clear liquids allowed by your physician.          At 11:00 A. M. fill the Nulytely/Golytely jug to the ?fill? line with lukewarm drinking water.  Cap the jug and shake to dissolve the powder.  Place the jug in the refigerator.  At 4:00 P.M. drink one 8-ounce glass of Nulytely/Golytely and repeat every 10-15 minutes until you have finished 3/4 gallon of Nulytely/Golytely 3 liters. If you become nauseated hold off on drinking for 30 minutes or so, then resume. Place remaining liquid in refrigerator.     Day of Test:   Five hours before  your scheduled procedure time, drink the remaining ? gallon of Nulytely/Golytely 8-ounces at a time. Repeat every 10-15 minutes until you have finished the container.  You may drink clear liquids up until 4 hours before your scheduled procedure time. After this you should have nothing by mouth, this includes GUM or CANDY.  Chewing tobacco must be stopped 6 hours before your scheduled procedure.  If you have an early morning test, take ONLY your essential morning medications (heart, blood pressure, etc.) with a small sip of water.  You will be sedated for the procedure, so a responsible adult must drive you home (no Benedetto Goad, taxis or buses are allowed). If you do not have a driver, we will be unable to do the test.  You will be here for 3-4 hours from arrival time.  You will not be able to return to work the same day.    Please bring a list of your current medications and the dosages with you.

## 2022-07-14 ENCOUNTER — Encounter: Admit: 2022-07-14 | Discharge: 2022-07-14 | Payer: MEDICARE

## 2022-07-14 DIAGNOSIS — R197 Diarrhea, unspecified: Secondary | ICD-10-CM

## 2022-07-14 DIAGNOSIS — K6289 Other specified diseases of anus and rectum: Secondary | ICD-10-CM

## 2022-07-19 ENCOUNTER — Encounter: Admit: 2022-07-19 | Discharge: 2022-07-19 | Payer: MEDICARE

## 2022-07-19 ENCOUNTER — Ambulatory Visit: Admit: 2022-07-19 | Discharge: 2022-07-19 | Payer: MEDICARE

## 2022-07-19 DIAGNOSIS — R911 Solitary pulmonary nodule: Secondary | ICD-10-CM

## 2022-07-20 ENCOUNTER — Encounter: Admit: 2022-07-20 | Discharge: 2022-07-20 | Payer: MEDICARE

## 2022-07-28 ENCOUNTER — Ambulatory Visit: Admit: 2022-07-28 | Discharge: 2022-07-28 | Payer: MEDICARE

## 2022-07-28 DIAGNOSIS — R197 Diarrhea, unspecified: Secondary | ICD-10-CM

## 2022-07-28 DIAGNOSIS — K6289 Other specified diseases of anus and rectum: Secondary | ICD-10-CM

## 2022-08-04 ENCOUNTER — Encounter: Admit: 2022-08-04 | Discharge: 2022-08-04 | Payer: MEDICARE

## 2022-08-04 ENCOUNTER — Ambulatory Visit: Admit: 2022-08-04 | Discharge: 2022-08-05 | Payer: MEDICARE

## 2022-08-04 DIAGNOSIS — M8580 Other specified disorders of bone density and structure, unspecified site: Secondary | ICD-10-CM

## 2022-08-04 DIAGNOSIS — R002 Palpitations: Secondary | ICD-10-CM

## 2022-08-04 DIAGNOSIS — I10 Essential (primary) hypertension: Secondary | ICD-10-CM

## 2022-08-04 DIAGNOSIS — E669 Obesity, unspecified: Secondary | ICD-10-CM

## 2022-08-04 DIAGNOSIS — J449 Chronic obstructive pulmonary disease, unspecified: Secondary | ICD-10-CM

## 2022-08-04 DIAGNOSIS — M1990 Unspecified osteoarthritis, unspecified site: Secondary | ICD-10-CM

## 2022-08-04 DIAGNOSIS — E785 Hyperlipidemia, unspecified: Secondary | ICD-10-CM

## 2022-08-04 DIAGNOSIS — R159 Full incontinence of feces: Secondary | ICD-10-CM

## 2022-08-04 DIAGNOSIS — K561 Intussusception: Secondary | ICD-10-CM

## 2022-08-04 DIAGNOSIS — N8189 Other female genital prolapse: Secondary | ICD-10-CM

## 2022-08-04 DIAGNOSIS — I499 Cardiac arrhythmia, unspecified: Secondary | ICD-10-CM

## 2022-08-05 DIAGNOSIS — R933 Abnormal findings on diagnostic imaging of other parts of digestive tract: Secondary | ICD-10-CM

## 2022-08-05 DIAGNOSIS — K623 Rectal prolapse: Secondary | ICD-10-CM

## 2022-08-12 ENCOUNTER — Encounter: Admit: 2022-08-12 | Discharge: 2022-08-12 | Payer: MEDICARE

## 2022-08-12 NOTE — Telephone Encounter
New Patient Appointment with Colorectal Surgery (CRS)  Date: Tues (10/25/22)  Time: 9am  CRS Provider Name (full name): Dr. Eulah Pont    Clinic Location [facility] or [facility] w/address): [employer], [address] [place], [address], [place], [place]  [address]    Verbal directions to appointment given on (date):  8.18.23   Patient has MyChart Access (yes, no w/reason or date instructions sent to pt):yes       Other Appt/Tests Coordinated with this Appt (n/a or test name w/date & time): n/a    Need Hoyer Lift (yes or n/a. For disabled patients.): n/a    Engineer, technical sales (n/a, Language or Language & ID # if used for this appt): n/a     Patient expressed understanding of below:   -Check-in 15-30 min early, if possible (yes/no): yes   -Wait List (yes, n/a or pt declined): yes  -No-Show/Late Cancellation (NS/LC) Policy (yes, or no w/reason): yes  The surgeons ask that we let all patients know of the NS/LC Policy here at [facility] for all appointments: If you need to cancel/reschedule, please try and let us know as far in advance as possible. If you cancel after 12PM the day before the appointment, it is considered a NS/LC. When you accumulate NS/LC, it gives the provider the option to review and possibly decline future appointments.     -Insurance Referral Authorization (Tricare Prime, [place] CCN, [employer] (umbrella auth/referral from PCP only), [employer] (Health Dept Approval), [facility], [employer] Navigate (umbrella auth/referral from PCP only) [place] [employer] (with name of [place]) or Self-Pay (n/a, Self-Pay or Auth # & date range): n/a    CRS Provider Preference (any or name of provider(s)): Dr. Eulah Pont    Referring Provider (name & specialty): Dr. Robbie Lis (URO GYN)  Care Team Updated on (date): 8.18.23    Diagnosis:   intussusception, rectal prolapse      Patient understands PCP/Referring Provider to follow and assist with managing symptoms until seen by a specialist, and advocate as needed (yes/no): yes    Patient understands the benefits of establishing care with a specialist and once established, we follow for three(3) years and renews after each visit (yes/no): yes     Verbal records history from (patient, name of family member, caregiver, etc.): patient    Continuation of Care    GI Lab Testing/Procedure Reports with any Associated Pathology (FROM LAST 10 YEARS):   Colonoscopy, EGD, Flexible Sigmoidoscopy, Anorectal Manometry, Capsule Endoscopy, Endoscopic Ultrasound   Patient GI Testing History (Approx year, name of test, facility and/or provider name as needed. Or document, Pt with no memory of provider/facility name.):     approx 5/6 yrs ago - colonoscopy by Dr. Elenor Legato (GI), [place], [place]. Did last 2 colonoscopies. RBL hemorrhoid.     EGD at [facility]    1.19.23 - ARM at [facility]    Operative Report, Surgical Pathology & Discharge Summary (FROM LIFETIME): Abdominal & Pelvic Surgeries, Colon, Rectal & Anal Surgeries   Patient Abdominal/Pelvic Surgery History (Approx year, non-clinical name of surgery, facility and/or provider name as needed. Or document, Pt with no memory of provider/facility name.):     approx 1980's - appendix removal     1972 - lap tubal ligation    Radiology Imaging Reports & Cloud Images (FROM LAST 5 YEARS):    CT c/a/p, MRI a/p, MRE, Xray: Abdomen (KUB), Pelvis, Acute Abdominal Series, Small Bowel Follow Thru, PET Scan: Full Body or Skull to Thighs. Colon Single Contrast &/  or MRI Defecography, Barium/Gastrographin Enemas, Sitz Marker Study  Patient Radiology History (List name of Hospital(s)/Facilities(s) only):     1.30.23 - Defecography at [facility]    Pelvic Floor Physical Therapy Progress Notes or Biofeedback Testing/Pelvic EMG Testing (FROM LAST 5 YEARS).  Patient Physical Therapy History (n/a or list timeframe w/facility name. Common diagnoses w/PT: constipation, fecal incontinence, pelvic floor dysfunction, rectocele & rectal prolapse):     No PT hx  [facility], [place], [place] - recent MRI head/neck    Gastroenterology Office Visit Notes/Progress Notes (FROM LAST 5 YEARS).  Patient Office Visit Notes/Progress Notes History: CNC/RN to review and request as needed.     Emailed Records/Referral from Physician Consult to [email or web address] (scan into O2) and corresponding CNC/RN (n/a or RN name w/date): n/a

## 2022-09-01 ENCOUNTER — Encounter: Admit: 2022-09-01 | Discharge: 2022-09-01 | Payer: MEDICARE

## 2022-09-01 MED ORDER — PRAVASTATIN 10 MG PO TAB
ORAL_TABLET | ORAL | 3 refills | 90.00000 days | Status: AC
Start: 2022-09-01 — End: ?

## 2022-09-21 ENCOUNTER — Encounter: Admit: 2022-09-21 | Discharge: 2022-09-21 | Payer: MEDICARE

## 2022-09-21 ENCOUNTER — Ambulatory Visit: Admit: 2022-09-21 | Discharge: 2022-09-21 | Payer: MEDICARE

## 2022-09-21 DIAGNOSIS — I10 Essential (primary) hypertension: Secondary | ICD-10-CM

## 2022-09-21 DIAGNOSIS — M8580 Other specified disorders of bone density and structure, unspecified site: Secondary | ICD-10-CM

## 2022-09-21 DIAGNOSIS — M1990 Unspecified osteoarthritis, unspecified site: Secondary | ICD-10-CM

## 2022-09-21 DIAGNOSIS — I499 Cardiac arrhythmia, unspecified: Secondary | ICD-10-CM

## 2022-09-21 DIAGNOSIS — R0989 Other specified symptoms and signs involving the circulatory and respiratory systems: Secondary | ICD-10-CM

## 2022-09-21 DIAGNOSIS — E669 Obesity, unspecified: Secondary | ICD-10-CM

## 2022-09-21 DIAGNOSIS — R002 Palpitations: Secondary | ICD-10-CM

## 2022-09-21 DIAGNOSIS — E782 Mixed hyperlipidemia: Secondary | ICD-10-CM

## 2022-09-21 DIAGNOSIS — J449 Chronic obstructive pulmonary disease, unspecified: Secondary | ICD-10-CM

## 2022-09-21 DIAGNOSIS — E785 Hyperlipidemia, unspecified: Secondary | ICD-10-CM

## 2022-09-21 NOTE — Patient Instructions
We will plan to see you back in a year

## 2022-10-03 ENCOUNTER — Encounter: Admit: 2022-10-03 | Discharge: 2022-10-03 | Payer: MEDICARE

## 2022-10-18 ENCOUNTER — Encounter: Admit: 2022-10-18 | Discharge: 2022-10-18 | Payer: MEDICARE

## 2022-10-25 ENCOUNTER — Encounter: Admit: 2022-10-25 | Discharge: 2022-10-25 | Payer: MEDICARE

## 2022-10-25 DIAGNOSIS — M1990 Unspecified osteoarthritis, unspecified site: Secondary | ICD-10-CM

## 2022-10-25 DIAGNOSIS — E785 Hyperlipidemia, unspecified: Secondary | ICD-10-CM

## 2022-10-25 DIAGNOSIS — R159 Full incontinence of feces: Secondary | ICD-10-CM

## 2022-10-25 DIAGNOSIS — R002 Palpitations: Secondary | ICD-10-CM

## 2022-10-25 DIAGNOSIS — J449 Chronic obstructive pulmonary disease, unspecified: Secondary | ICD-10-CM

## 2022-10-25 DIAGNOSIS — E669 Obesity, unspecified: Secondary | ICD-10-CM

## 2022-10-25 DIAGNOSIS — I10 Essential (primary) hypertension: Secondary | ICD-10-CM

## 2022-10-25 DIAGNOSIS — I499 Cardiac arrhythmia, unspecified: Secondary | ICD-10-CM

## 2022-10-25 DIAGNOSIS — M8580 Other specified disorders of bone density and structure, unspecified site: Secondary | ICD-10-CM

## 2022-10-25 NOTE — Patient Instructions
A referral for Pelvic Floor Physical Therapy has been ordered for you.      The [facility] office will not call to schedule your first appointment, so please contact the clinic at [phone] to make your first appointment.     The goal of pelvic health therapy is to maximize your function and quality of life related to your pelvic health complications.  The evaluation with the pelvic floor therapist will include review of your medical history and current challenges specifically related to bowel, bladder, and pelvic pain or dysfunction. A comprehensive musculoskeletal exam including core, hip, and, if indicated, internal pelvic floor muscle assessment will be completed as well.  Subsequent visits may include exercises, breathing techniques, nutrition review, and biofeedback. Your individual care plan, including frequency and number of visits, will be determined after your assessment with the pelvic floor physical therapist.    If you are from out of town and/or would prefer to perform pelvic floor physical therapy at a location closer to home, use this website (click here) or visit [email or web address].     The website is a tool for Pelvic Health Therapists throughout the [place]. We are unable to confirm each location's ability to provide specific equipment and training. You will need to call the desired location to confirm they can perform pelvic floor physical therapy for your health condition.     Some limiting factors with using an external office may include the following:  Only accept female patients,   Do not perform biofeedback therapy (EMG)  Only perform therapy related to urinary issues

## 2022-11-07 ENCOUNTER — Encounter: Admit: 2022-11-07 | Discharge: 2022-11-07 | Payer: MEDICARE

## 2022-11-10 ENCOUNTER — Encounter: Admit: 2022-11-10 | Discharge: 2022-11-10 | Payer: MEDICARE

## 2022-11-30 ENCOUNTER — Encounter: Admit: 2022-11-30 | Discharge: 2022-11-30 | Payer: MEDICARE

## 2022-12-12 ENCOUNTER — Encounter: Admit: 2022-12-12 | Discharge: 2022-12-12 | Payer: MEDICARE

## 2022-12-12 DIAGNOSIS — R002 Palpitations: Secondary | ICD-10-CM

## 2022-12-12 DIAGNOSIS — M8580 Other specified disorders of bone density and structure, unspecified site: Secondary | ICD-10-CM

## 2022-12-12 DIAGNOSIS — E785 Hyperlipidemia, unspecified: Secondary | ICD-10-CM

## 2022-12-12 DIAGNOSIS — I10 Essential (primary) hypertension: Secondary | ICD-10-CM

## 2022-12-12 DIAGNOSIS — E669 Obesity, unspecified: Secondary | ICD-10-CM

## 2022-12-12 DIAGNOSIS — R7303 Prediabetes: Secondary | ICD-10-CM

## 2022-12-12 DIAGNOSIS — M1990 Unspecified osteoarthritis, unspecified site: Secondary | ICD-10-CM

## 2022-12-12 DIAGNOSIS — J449 Chronic obstructive pulmonary disease, unspecified: Secondary | ICD-10-CM

## 2022-12-12 DIAGNOSIS — I499 Cardiac arrhythmia, unspecified: Secondary | ICD-10-CM

## 2022-12-12 DIAGNOSIS — C801 Malignant (primary) neoplasm, unspecified: Secondary | ICD-10-CM

## 2022-12-22 ENCOUNTER — Encounter: Admit: 2022-12-22 | Discharge: 2022-12-22 | Payer: MEDICARE

## 2023-01-01 ENCOUNTER — Encounter: Admit: 2023-01-01 | Discharge: 2023-01-01 | Payer: MEDICARE

## 2023-01-02 ENCOUNTER — Ambulatory Visit: Admit: 2023-01-02 | Discharge: 2023-01-02 | Payer: MEDICARE

## 2023-01-02 ENCOUNTER — Encounter: Admit: 2023-01-02 | Discharge: 2023-01-02 | Payer: MEDICARE

## 2023-01-02 DIAGNOSIS — E785 Hyperlipidemia, unspecified: Secondary | ICD-10-CM

## 2023-01-02 DIAGNOSIS — I10 Essential (primary) hypertension: Secondary | ICD-10-CM

## 2023-01-02 DIAGNOSIS — E669 Obesity, unspecified: Secondary | ICD-10-CM

## 2023-01-02 DIAGNOSIS — J449 Chronic obstructive pulmonary disease, unspecified: Secondary | ICD-10-CM

## 2023-01-02 DIAGNOSIS — R7303 Prediabetes: Secondary | ICD-10-CM

## 2023-01-02 DIAGNOSIS — R002 Palpitations: Secondary | ICD-10-CM

## 2023-01-02 DIAGNOSIS — I499 Cardiac arrhythmia, unspecified: Secondary | ICD-10-CM

## 2023-01-02 DIAGNOSIS — M1990 Unspecified osteoarthritis, unspecified site: Secondary | ICD-10-CM

## 2023-01-02 DIAGNOSIS — C801 Malignant (primary) neoplasm, unspecified: Secondary | ICD-10-CM

## 2023-01-02 DIAGNOSIS — M8580 Other specified disorders of bone density and structure, unspecified site: Secondary | ICD-10-CM

## 2023-01-02 MED ORDER — LIDOCAINE (PF) 100 MG/5 ML (2 %) IV SYRG
INTRAVENOUS | 0 refills | Status: DC
Start: 2023-01-02 — End: 2023-01-02

## 2023-01-02 MED ORDER — PROPOFOL 10 MG/ML IV EMUL 50 ML (INFUSION)(AM)(OR)
INTRAVENOUS | 0 refills | Status: DC
Start: 2023-01-02 — End: 2023-01-02

## 2023-01-02 MED ORDER — PROPOFOL INJ 10 MG/ML IV VIAL
INTRAVENOUS | 0 refills | Status: DC
Start: 2023-01-02 — End: 2023-01-02

## 2023-01-02 NOTE — Telephone Encounter
Pre procedure review  Lower EUS - anal sphincter incompetence, diarrhea  Referring: Royce Macadamia  Records in notes      [facility] 01/13/2022

## 2023-01-02 NOTE — Anesthesia Post-Procedure Evaluation
Post-Anesthesia Evaluation    Name: Shari Spence      MRN: [id number]     DOB: Dec 14, 1936     Age: 87 y.o.     Sex: female   __________________________________________________________________________     Procedure Information       Anesthesia Start Date/Time: 01/02/23 0802    Procedure: SIGMOIDOSCOPY WITH ENDOSCOPIC ULTRASOUND EXAMINATION - FLEXIBLE    Location: [facility] GI/ENDO RM 2 / [place] OR    Surgeons: Threasa Alpha, MD            Post-Anesthesia Vitals  BP: 157/87 (01/08 0835)  Temp: 36.4 C (97.5 F) (01/08 0820)  Pulse: 68 (01/08 0835)  Respirations: 16 PER MINUTE (01/08 0835)  SpO2: 97 % (01/08 0835)  O2 Device: None (Room air) (01/08 0835)  Height: 172.7 cm ('5\' 8"'$ ) (01/08 0713)   Vitals Value Taken Time   BP 157/87 01/02/23 0835   Temp 36.4 C (97.5 F) 01/02/23 0820   Pulse 68 01/02/23 0835   Respirations 16 PER MINUTE 01/02/23 0835   SpO2 97 % 01/02/23 0835   O2 Device None (Room air) 01/02/23 0835   ABP     ART BP           Post Anesthesia Evaluation Note    Evaluation location: Pre/Post  Patient participation: recovered; patient participated in evaluation  Level of consciousness: alert    Pain score: 0  Pain management: adequate    Hydration: normovolemia  Temperature: 36.0C - 38.4C  Airway patency: adequate    Perioperative Events       Post-op nausea and vomiting: no PONV    Postoperative Status  Cardiovascular status: hemodynamically stable  Respiratory status: spontaneous ventilation        Perioperative Events  There were no known notable events for this encounter.

## 2023-01-02 NOTE — Anesthesia Pre-Procedure Evaluation
Anesthesia Pre-Procedure Evaluation    Name: MARCINA RISCH      MRN: [id number]     DOB: November 29, 1936     Age: 87 y.o.     Sex: female   _________________________________________________________________________     Procedure Date: 01/02/2023   Procedure: Procedure(s):  SIGMOIDOSCOPY WITH ENDOSCOPIC ULTRASOUND EXAMINATION - FLEXIBLE     Physical Assessment  Vital Signs (last filed in past 24 hours):  BP: 164/75 (01/08 0713)  Temp: 36.3 ?C (97.4 ?F) (01/08 4540)  Pulse: 67 (01/08 0713)  Respirations: 16 PER MINUTE (01/08 0713)  SpO2: 98 % (01/08 0713)  O2 Device: None (Room air) (01/08 0713)  Height: 172.7 cm (5' 8) (01/08 9811)  Weight: 72.6 kg (160 lb) (01/08 0713)      Patient History  Allergies   Allergen Reactions    Epinephrine SEE COMMENTS     Irregular heart rate.         Current Medications    Medication Directions   acetaminophen SR (TYLENOL ARTHRITIS PAIN) 650 mg tablet Take one tablet by mouth every 6 hours as needed for Pain.   brimonidine (ALPHAGAN P) 0.15 % ophthalmic solution Apply one drop to both eyes three times daily.   calcium carbonate/vitamin D3 (CALCIUM 600 + D PO) Take 2 tablets by mouth daily.   cholecalciferol (VITAMIN D-3) 5000 unit tablet Take one tablet by mouth every 7 days.   CoQ10 (Ubiquinol) 200 mg cap Take one capsule by mouth daily.   cyanocobalamin (vitamin B-12) 5,000 mcg subl Place one tablet under tongue daily.   docusate (COLACE) 100 mg capsule Take one capsule by mouth daily.   dorzolamide-timoloL (COSOPT) 2-0.5 % ophthalmic solution INSTILL 1 DROP INTO EACH EYE TWICE DAILY   ezetimibe (ZETIA) 10 mg tablet Take one tablet by mouth daily.   Folic Acid 800 mcg tab Take one tablet by mouth daily.   hydroCHLOROthiazide (HYDRODIURIL) 12.5 mg tablet Take one tablet by mouth every morning.   hydrocortisone 2.5 % topical cream Apply  topically to affected area twice daily.   latanoprost (XALATAN) 0.005 % ophthalmic solution Place one drop into or around eye(s) at bedtime daily.   losartan (COZAAR) 50 mg tablet Take one tablet by mouth daily.   melatonin 10 mg tab Take one tablet by mouth at bedtime daily.   omega-3 fatty acids-vitamin E 1,000 mg cap Take one capsule by mouth daily.   other medication Glucomasine/Chondroitin 1500/200: take 2 tablets by mouth daily   Potassium 99 mg tab Take one tablet by mouth daily.   pravastatin (PRAVACHOL) 10 mg tablet TAKE 1 TABLET BY MOUTH ONCE DAILY AT BEDTIME   vitamins, multi w/minerals 9 mg iron-400 mcg tab Take one tablet by mouth daily.         Review of Systems/Medical History      Patient summary reviewed  Nursing notes reviewed  Pertinent labs reviewed    PONV Screening: Non-smoker and Female sex    No family history of anesthetic complications      Airway - negative        Pulmonary           Asthma well controlled      Cardiovascular         Exercise tolerance: >4 METS      Beta Blocker therapy: No      Beta blockers within 24 hours: n/a      Hypertension, well controlled          Valvular problems/murmurs:  Patient reports that she had been diagnosed with MVP in past, but later was confirmed by her Cardiologist that she does not have MVP.                Hyperlipidemia      Very active  2011 echo: The left ventricle is normal in size and function, estimated ejection fraction is 65%          GI/Hepatic/Renal                 Melena      Neuro/Psych - negative        Musculoskeletal         Arthritis (s/p hip replacement):         Endocrine/Other - negative               Physical Exam    Airway Findings      Mallampati: II      TM distance: >3 FB      Neck ROM: full      Mouth opening: good      Airway patency: adequate    Dental Findings:       Partials    Cardiovascular Findings:       Rhythm: regular      Pulmonary Findings:       Breath sounds clear to auscultation.    Abdominal Findings:         Abdominal exam deferred    Neurological Findings:       Alert and oriented x 3    Constitutional findings:       No acute distress       Diagnostic Tests  Hematology:   Lab Results   Component Value Date    HGB 14.2 09/09/2020    HCT 41.4 09/09/2020    PLTCT 226 09/09/2020    WBC 8.9 09/09/2020    NEUT 63 09/09/2020    ANC 5.66 09/09/2020    ALC 2.33 09/09/2020    MONA 9 09/09/2020    AMC 0.77 09/09/2020    EOSA 1 09/09/2020    ABC 0.04 09/09/2020    MCV 92.8 09/09/2020    MCH 31.7 09/09/2020    MCHC 34.2 09/09/2020    MPV 8.6 09/09/2020    RDW 13.4 09/09/2020         General Chemistry:   Lab Results   Component Value Date    NA 139 07/04/2022    K 4.3 07/04/2022    CL 106 07/04/2022    CO2 26 07/04/2022    GAP 1.1 07/04/2022    BUN 14 07/04/2022    CR 0.84 07/04/2022    GLU 101 07/04/2022    CA 10 07/04/2022    ALBUMIN 3.9 07/04/2022    TOTBILI 0.47 07/04/2022      Coagulation:   Lab Results   Component Value Date    PTT 36.3 09/09/2020    INR 1.0 09/09/2020         Anesthesia Plan    ASA score: 2   Plan: MAC  Induction method: intravenous  NPO status: acceptable      Informed Consent  Anesthetic plan and risks discussed with patient.  Use of blood products discussed with patient  Blood Consent: consented      Plan discussed with: anesthesiologist and CRNA.    PAC Plan  Alerts: No

## 2023-01-03 ENCOUNTER — Encounter: Admit: 2023-01-03 | Discharge: 2023-01-03 | Payer: MEDICARE

## 2023-01-03 DIAGNOSIS — I10 Essential (primary) hypertension: Secondary | ICD-10-CM

## 2023-01-03 DIAGNOSIS — E669 Obesity, unspecified: Secondary | ICD-10-CM

## 2023-01-03 DIAGNOSIS — M1990 Unspecified osteoarthritis, unspecified site: Secondary | ICD-10-CM

## 2023-01-03 DIAGNOSIS — I499 Cardiac arrhythmia, unspecified: Secondary | ICD-10-CM

## 2023-01-03 DIAGNOSIS — R7303 Prediabetes: Secondary | ICD-10-CM

## 2023-01-03 DIAGNOSIS — M8580 Other specified disorders of bone density and structure, unspecified site: Secondary | ICD-10-CM

## 2023-01-03 DIAGNOSIS — E785 Hyperlipidemia, unspecified: Secondary | ICD-10-CM

## 2023-01-03 DIAGNOSIS — J449 Chronic obstructive pulmonary disease, unspecified: Secondary | ICD-10-CM

## 2023-01-03 DIAGNOSIS — C801 Malignant (primary) neoplasm, unspecified: Secondary | ICD-10-CM

## 2023-01-03 DIAGNOSIS — R002 Palpitations: Secondary | ICD-10-CM

## 2023-01-10 ENCOUNTER — Encounter: Admit: 2023-01-10 | Discharge: 2023-01-10 | Payer: MEDICARE

## 2023-01-10 ENCOUNTER — Ambulatory Visit: Admit: 2023-01-10 | Discharge: 2023-01-10 | Payer: MEDICARE

## 2023-01-10 DIAGNOSIS — E785 Hyperlipidemia, unspecified: Secondary | ICD-10-CM

## 2023-01-10 DIAGNOSIS — Z87898 Personal history of other specified conditions: Secondary | ICD-10-CM

## 2023-01-10 DIAGNOSIS — E669 Obesity, unspecified: Secondary | ICD-10-CM

## 2023-01-10 DIAGNOSIS — M8580 Other specified disorders of bone density and structure, unspecified site: Secondary | ICD-10-CM

## 2023-01-10 DIAGNOSIS — M1990 Unspecified osteoarthritis, unspecified site: Secondary | ICD-10-CM

## 2023-01-10 DIAGNOSIS — K623 Rectal prolapse: Secondary | ICD-10-CM

## 2023-01-10 DIAGNOSIS — J449 Chronic obstructive pulmonary disease, unspecified: Secondary | ICD-10-CM

## 2023-01-10 DIAGNOSIS — K561 Intussusception: Secondary | ICD-10-CM

## 2023-01-10 DIAGNOSIS — R7303 Prediabetes: Secondary | ICD-10-CM

## 2023-01-10 DIAGNOSIS — N812 Incomplete uterovaginal prolapse: Secondary | ICD-10-CM

## 2023-01-10 DIAGNOSIS — N3946 Mixed incontinence: Secondary | ICD-10-CM

## 2023-01-10 DIAGNOSIS — I499 Cardiac arrhythmia, unspecified: Secondary | ICD-10-CM

## 2023-01-10 DIAGNOSIS — N8110 Cystocele, unspecified: Secondary | ICD-10-CM

## 2023-01-10 DIAGNOSIS — C801 Malignant (primary) neoplasm, unspecified: Secondary | ICD-10-CM

## 2023-01-10 DIAGNOSIS — I10 Essential (primary) hypertension: Secondary | ICD-10-CM

## 2023-01-10 DIAGNOSIS — R002 Palpitations: Secondary | ICD-10-CM

## 2023-01-10 DIAGNOSIS — R3989 Other symptoms and signs involving the genitourinary system: Secondary | ICD-10-CM

## 2023-01-10 MED ORDER — AMOXICILLIN 500 MG PO CAP
500 mg | Freq: Once | ORAL | 0 refills | Status: CP
Start: 2023-01-10 — End: ?
  Administered 2023-01-10: 17:00:00 500 mg via ORAL

## 2023-01-10 NOTE — Patient Instructions
Today you underwent a urodynamics procedure.  It is normal for some patients to feel discomfort with voiding (urinating) for the following 24 hours.  You may see a pink color of urine on the toilet paper with wiping after the first few times you void (urinate).      Urinary tract infections can occur after urodynamics, although very rarely.  We often administer an oral antibiotic to you at the time of the procedure.  If your physician has concerns that you are more at risk of developing a urinary tract infection, you will be prescribed additional antibiotics to take for several more days.      Please call our office if you develop symptoms of a UTI:   Burning with urination lasting more than 24 hours after the procedure   Bladder pressure    New onset of increased frequency of needing to go to the bathroom in the day or night   Increased urgency associated with voiding/urinating   New onset of low back pain that is not in the middle of the back   Chills or low grade fever (temperature above 101.4)    Symptoms of a more severe urinary tract infection that could involve the kidneys are uncommon.  Please go to the nearest emergency room or urgent care if you develop:   Bloody urine   Mental confusion   Mid back pain   Temperature >101.4 degrees   Chills

## 2023-01-10 NOTE — Procedures
URODYNAMIC CLINIC REPORT     Shari Spence was seen for UDS on  01/10/2023 for the following indications:    Vaginal prolapse planning surgery - typically no incontinence    The patient was brought to the urodynamics room. She was asked to void with evaluation by uroflow. A urine dip confirmed no evidence of infection.     A 7 Fr urodynamic catheter was placed intravesically (Pura, Pves) and in the vagina (Pabd). There was reduction of vaginal prolapse with 2 colposwabs. EMG stickers were affixed to the bilateral buttock and grounded on the patient's knee. The patient was in the sitting position and was asked to cough to confirm proper placement of the pressure catheters. Cystometrogram was performed with filling at up to 50 mL/min. Incremental sensations of fullness were recorded, and a cough stress test and valsalva stress test were performed as below. A urethral closure pressure profile with a puller evaluated the urethral patency.  The patient was asked to void with the catheters in place for a pressure flow study. At the conclusion of the study all of the catheters, EMG stickers, and prolapse reducing devices were removed.     UROFLOW:         Maximum flow:   9 ml/s          Average flow:   3.3 ml/s      Voided volume:   112 ml       Post void residual: 60 ml (cath)       Cath specimen dip  (neg)nitrites        CYSTOMETROGRAM:   First sensation:   14 ml    (normal is 175-366ml) 50% of maximum capacity  First desire:   121 ml  (normal is 260-457ml) 75% of maximum capacity  Strong desire:   190 ml    (normal is 315-541ml  90 % of maximum capacity  Maximum capacity:  275 ml  (normal is 350-659ml)    Are uninhibited detrusor contractions present? No  Are induced detrussor contractions present?  No    LEAK POINT PRESSURE: No leak with incontinence    URETHRAL CLOSURE PRESSURE PROFILE:   Max urethral pressure 101 cmH2O, 109 cmH2O.     PRESSURE FLOW STUDIES   Max flow rate   7.5 ml/sec   Average flow rate  3.3 ml/sec Pressure at peak flow  43 cmH2O   Volume voided   226 ml  PVR   55 ml (calculated)  __________________________________________________________________________________    Obstruction:  not observed  Voiding pattern: normal    01/10/23 URODYNAMICS IMPRESSION:   1) Uroflowmetry showed a prolonged intermittent flow curve. There was no evidence of retention. (void 112 mL, PVR 60 mL)  2) There was sensory urgency with a capacity of 275 mL.   3) Urodynamic stress urinary incontinence was not present with a normal pressure urethra.  (UCPP 101 cmH2O)  4) Detrusor overactivity was not present.  5) The voiding pattern was functional

## 2023-01-19 ENCOUNTER — Encounter: Admit: 2023-01-19 | Discharge: 2023-01-19 | Payer: MEDICARE

## 2023-01-24 ENCOUNTER — Encounter: Admit: 2023-01-24 | Discharge: 2023-01-24 | Payer: MEDICARE

## 2023-01-24 DIAGNOSIS — M8580 Other specified disorders of bone density and structure, unspecified site: Secondary | ICD-10-CM

## 2023-01-24 DIAGNOSIS — E785 Hyperlipidemia, unspecified: Secondary | ICD-10-CM

## 2023-01-24 DIAGNOSIS — E669 Obesity, unspecified: Secondary | ICD-10-CM

## 2023-01-24 DIAGNOSIS — J449 Chronic obstructive pulmonary disease, unspecified: Secondary | ICD-10-CM

## 2023-01-24 DIAGNOSIS — C801 Malignant (primary) neoplasm, unspecified: Secondary | ICD-10-CM

## 2023-01-24 DIAGNOSIS — I499 Cardiac arrhythmia, unspecified: Secondary | ICD-10-CM

## 2023-01-24 DIAGNOSIS — I10 Essential (primary) hypertension: Secondary | ICD-10-CM

## 2023-01-24 DIAGNOSIS — R002 Palpitations: Secondary | ICD-10-CM

## 2023-01-24 DIAGNOSIS — R159 Full incontinence of feces: Secondary | ICD-10-CM

## 2023-01-24 DIAGNOSIS — R7303 Prediabetes: Secondary | ICD-10-CM

## 2023-01-24 DIAGNOSIS — M1990 Unspecified osteoarthritis, unspecified site: Secondary | ICD-10-CM

## 2023-01-24 NOTE — Patient Instructions
Thank you for visiting me today. Your time is important and if you had to wait at all today, I apologize. My goal is to run exactly on time; however, on occasion, I get behind in clinic due to unexpected patient issues or space constraints. Please use the nurses line below if you have any questions or concerns at any time. Leave a message if necessary and your call will be returned. Feel free to send us a MyChart message as well. Messages are reviewed Monday through Friday 8am to 4pm. If it is after 4pm, it may be returned the following business day. Our office is closed on weekends and major holidays. If you have an urgent need after hours please call [phone] and ask for the surgical oncology resident on call to be paged.     If testing was ordered today please know it will automatically be released to your mychart. You may review this before I do. I will send you a message with this result once I have reviewed this.     Please call my nurse listed below for questions or concerns.  Kiersten Galemore (Dr. Martin and Camille Dragan APRN) - ph. [phone] fax. [phone]     Enyla Lisbon M. Camila Maita MSN, APRN, AGCNS-BC, AGPCNP-BC  Advanced Practice Provider  Colon and Rectal Surgery  Surgical Oncology  APP for Dr. Ashcraft, Dr. Martin, and Dr. Luka    Imodium or Loperamide is an antidiarrheal. This can be purchased over the counter but typically only comes in small quantities and only blister pack. Sometimes this can be covered through your insurance. We can try to send it through your pharmacy. If not you can buy it online.     You can order this through [facility].   They have it in a bottle and larger quantities.  [email or web address]

## 2023-01-24 NOTE — Progress Notes
Name: Shari Spence          MRN: [id number]      DOB: 1936-12-14      AGE: 87 y.o.   DATE OF SERVICE: 01/24/2023    Subjective:             Reason for Visit:  Follow Up      Shari Spence is a 87 y.o. female.      Cancer Staging   No matching staging information was found for the patient.    History of Present Illness  Shari Spence is a 87yo female who returns today for follow up regarding fecal urgency and incontinence. Approximately three months ago she started pelvic floor physical therapy and fiber supplement.  She reports noticing significant improvement. While things are much better she still has fears about eating too much at friends house while watching games. She will take a 1/2 tab of imodium which does help. She continues with pelvic floor physical therapy. She is taking 2tsp of fiber in the morning and going to do a 1/2tsp in the afternoon.     08/04/2022 Shari Spence:  ASSESSMENT:  1. Defocography showing intussusception, rectal prolapse, and a small anterior rectocele.  2. Pelvic exam today with stage 2 anterior vaginal prolapse (cystocele). well-supported uterus and stage 1 posterior vaginal prolapse (rectocele).  3. Atrophic vulvovaginal tissue consultation  4. Weak pelvic floor  5. 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 I also informed her that, surgically, I do not manage intussusception or rectal prolapse. However, I do surgically or medically manage cystoceles and rectoceles. She was given conservative options of pelvic floor PT or a pessary. She stated that she would not be comfortable with the pessary. She is sexually active. We would proceed with testing for urodynamics and plan a surgical repair (likely a cystocele and rectocele repair with cystoscopy). Urodynamics were scheduled today, and a referral was placed for a possible cosurgery with colorectal surgery.     PLAN:  1. Follow up urine culture.  2. Urodynamic studies.  3. Further consultation for surgery.     07/13/2022 - GI:  Discussed results of ARM and defecography. Patient wishes to proceed with endoscopic ultrasound. urogyn consult pending. Will likely need CR surgery input as well.     01/24/2022 - Defecography:  Findings consistent with marked pelvic floor laxity with associated rectal   prolapse and rectal mucosal intussusception.      01/13/2022 - ARM:  Impression:  Reduced squeeze pressure. Inadequate anal sphincter relaxation but patient was able to pass the balloon in 9 seconds. Reduced rectal compliance.   Recommend:  Anal EUS and flex sig to check for anal sphincter injury or proctitis. If no response to laxative therapy, check MR defecography and refer to biofeedback therapy.     Medical History:   Diagnosis Date   ? Arrhythmia     past hx per patient.   ? Arthritis    ? Cancer [facility])     skin cancer   ? COPD (chronic obstructive pulmonary disease) (HCC)    ? HTN (hypertension)    ? Hyperlipemia    ? Obesity    ? Osteopenia    ? Palpitations    ? Pre-diabetes      Surgical History:  Procedure Laterality Date   ? HX JOINT REPLACEMENT Right 11/2015    right hip    ? ESOPHAGOGASTRODUODENOSCOPY WITH BIOPSY - FLEXIBLE N/A 10/15/2018    Performed by Virgina Organ, MD at [facility] ENDO   ? ESOPHAGOGASTRODUODENOSCOPY WITH BIOPSY - FLEXIBLE N/A 01/16/2019    Performed by Normajean Baxter, MD at [facility] ENDO   ? Left forearm dorsal mass excision Left 10/16/2020    Performed by Letitia Neri, MD at [facility] OR   ? ANORECTAL MANOMETRY N/A 01/13/2022    Performed by Jolee Ewing, MD at [facility] ENDO   ? SIGMOIDOSCOPY WITH ENDOSCOPIC ULTRASOUND EXAMINATION - FLEXIBLE N/A 01/02/2023    Performed by Vertell Novak, MD at [facility] OR   ? CATARACT REMOVAL WITH IMPLANT Bilateral    ? HX APPENDECTOMY     ? KNEE SURGERY       Family History   Problem Relation Age of Onset   ? COPD Mother    ? Hypertension Sister      Social History     Socioeconomic History   ? Marital status: Married     Spouse name: Teresita Madura. Frederica Kuster   ? Number of children: 4   ? Years of education: 39   Tobacco Use   ? Smoking status: Never   ? Smokeless tobacco: Never   Substance and Sexual Activity   ? Alcohol use: Yes     Comment: rarely   ? Drug use: No   ? Sexual activity: Not Currently                      Review of Systems   Constitutional: Negative.  Negative for chills, fever and unexpected weight change.   HENT: Negative.     Eyes: Negative.    Respiratory: Negative.     Cardiovascular: Negative.    Gastrointestinal: Negative.  Negative for abdominal distention, abdominal pain, anal bleeding, blood in stool, constipation, diarrhea, nausea, rectal pain and vomiting.   Endocrine: Negative.    Genitourinary: Negative.    Musculoskeletal: Negative.    Skin: Negative.  Negative for color change and wound.   Allergic/Immunologic: Negative.    Neurological: Negative.  Negative for weakness and light-headedness.   Hematological: Negative.    Psychiatric/Behavioral: Negative.     All other systems reviewed and are negative.        Objective:         ? acetaminophen SR (TYLENOL ARTHRITIS PAIN) 650 mg tablet Take one tablet by mouth every 6 hours as needed for Pain.   ? brimonidine (ALPHAGAN P) 0.15 % ophthalmic solution Apply one drop to both eyes three times daily.   ? calcium carbonate/vitamin D3 (CALCIUM 600 + D PO) Take 2 tablets by mouth daily.   ? cholecalciferol (VITAMIN D-3) 5000 unit tablet Take one tablet by mouth every 7 days. Pt taking 50,000   ? CHOLEcalciferoL (vitamin D3) (OPTIMAL D3) 50,000 units capsule 2,000 mg.   ? CoQ10 (Ubiquinol) 200 mg cap Take one capsule by mouth daily.   ? cyanocobalamin (vitamin B-12) 5,000 mcg subl Place one tablet under tongue daily.   ? docusate (COLACE) 100 mg capsule Take one capsule by mouth daily.   ? dorzolamide-timoloL (COSOPT) 2-0.5 % ophthalmic solution INSTILL 1 DROP INTO EACH EYE TWICE DAILY   ? ezetimibe (ZETIA) 10 mg tablet Take one tablet by mouth daily.   ? Folic Acid 800 mcg tab Take one tablet by mouth daily.   ?  hydroCHLOROthiazide (HYDRODIURIL) 12.5 mg tablet Take one tablet by mouth every morning.   ? hydrocortisone 2.5 % topical cream Apply  topically to affected area twice daily.   ? latanoprost (XALATAN) 0.005 % ophthalmic solution Place one drop into or around eye(s) at bedtime daily.   ? losartan (COZAAR) 50 mg tablet Take one tablet by mouth daily.   ? melatonin 10 mg tab Take one tablet by mouth at bedtime daily.   ? omega-3 fatty acids-vitamin E 1,000 mg cap Take one capsule by mouth daily.   ? other medication Glucomasine/Chondroitin 1500/200: take 2 tablets by mouth daily   ? Potassium 99 mg tab Take one tablet by mouth daily.   ? pravastatin (PRAVACHOL) 10 mg tablet TAKE 1 TABLET BY MOUTH ONCE DAILY AT BEDTIME   ? vitamins, multi w/minerals 9 mg iron-400 mcg tab Take one tablet by mouth daily.     Vitals:    01/24/23 1208   Temp: 36.5 ?C (97.7 ?F)   TempSrc: Temporal   PainSc: Zero   Weight: 74.6 kg (164 lb 6.4 oz)     Body mass index is 25 kg/m?Marland Kitchen     Pain Score: Zero       Fatigue Scale: 0-None       Physical Exam  Vitals reviewed.   Constitutional:       General: She is not in acute distress.     Appearance: Normal appearance. She is well-developed.   HENT:      Head: Normocephalic and atraumatic.      Nose: Nose normal.   Eyes:      General: Lids are normal.      Conjunctiva/sclera: Conjunctivae normal.   Pulmonary:      Effort: Pulmonary effort is normal. No respiratory distress.   Musculoskeletal:         General: Normal range of motion.      Cervical back: Normal range of motion.   Neurological:      Mental Status: She is alert and oriented to person, place, and time.   Psychiatric:         Speech: Speech normal.         Behavior: Behavior normal.         Thought Content: Thought content normal.         Judgment: Judgment normal.             Assessment and Plan:  87yo female with fecal urgency and incontinence with significant improvement after starting fiber supplement.     Fecal Incontinence/Smearing:  - Fecal incontinence is a common problem but can be difficult and embarrassing to discuss. It's prevalence varies. Many patients often have coexisting pelvic floor disorders.   - We discussed first line treatment for fecal incontinence/smearing is stool bulking with fiber supplementation. We discussed how a bulkier and more formed stool is easier to control than loose or liquid stool.   - She has had significant improvement after starting fiber supplement.   - Imodium can be added to further form the stool. She uses the intermittently. I encouraged her to take 1/2 a tab Monday and Thursday. Can increase if she sees improvement.   - Continue to work with PFPT.   - A sacral nerve stimulator (SNS) such as an InterStim device may also be considered to improve fecal incontinence. Discussed and she does not want to proceed at this time.   - We have decided to proceed with continued fiber, PFPT, and schedule imodium.   -  Reviewed the rectal intussusception. As she's not symptomatic would not surgically intervene.     Plan:  - RTC via telehealth/telephone with Dr. Daphine Deutscher or APP to check on bowels with imodium addition.    Total Time Today was 30 minutes in the following activities: Preparing to see the patient, Counseling and educating the patient/family/caregiver, and Documenting clinical information in the electronic or other health record    .The patient and family were allowed to ask questions and voice concerns; these were addressed to the best of our ability. They expressed understanding of what was explained to them, and they agreed with the present plan. Patient has the phone numbers for the Cancer Center and was instructed on how to contact us with any questions or concerns.    My collaborating provider on this patient is Dr. Deedra Ehrich MD.    Brunilda Payor. Fredricka Bonine MSN, APRN, AGCNS-BC, AGPCNP-BC  Advanced Practice Provider  Colon and Rectal Surgery  Surgical Oncology  APP for Dr. Kathee Delton, Dr. Daphine Deutscher and Dr. Daiva Nakayama  Pager: [phone]  Available via Amie Critchley and [employer]

## 2023-01-26 ENCOUNTER — Encounter: Admit: 2023-01-26 | Discharge: 2023-01-26 | Payer: MEDICARE

## 2023-01-26 NOTE — Telephone Encounter
TELEPHONE CALL     After the meeting with Dr. Earlie Server APP [facility] Windle Guard, APRN-NP) Mrs. Degraff feels that her FI has improved significantly with conservative therapy.  She only has a Stage I rectocele and does not wish to proceed with surgery for her vaginal prolapse, as she does not have significant sx related to her vaginal prolapse.      We will cancel her telemedicine visit tomorrow.    Dellis Anes, MD

## 2023-01-27 ENCOUNTER — Encounter: Admit: 2023-01-27 | Discharge: 2023-01-27 | Payer: MEDICARE

## 2023-01-31 ENCOUNTER — Encounter: Admit: 2023-01-31 | Discharge: 2023-01-31 | Payer: MEDICARE

## 2023-04-20 NOTE — Progress Notes
.  This prechart is intended to be a reference for patient appointments. Information is gathered from in chart as well as external records review. Information will be clarified/verified/updated in final documentation in the office visit.     Pre Clinic Pre Chart:    Shari Spence is a 87yo female who returns today for follow up regarding fecal urgency and incontinence which was significantly improved at last visit.     Problem List:      08/04/2022 [name]:  ASSESSMENT:  1. Defocography showing intussusception, rectal prolapse, and a small anterior rectocele.  2. Pelvic exam today with stage 2 anterior vaginal prolapse (cystocele). well-supported uterus and stage 1 posterior vaginal prolapse (rectocele).  3. Atrophic vulvovaginal tissue consultation  4. Weak pelvic floor  5. 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 I also informed her that, surgically, I do not manage intussusception or rectal prolapse. However, I do surgically or medically manage cystoceles and rectoceles. She was given conservative options of pelvic floor PT or a pessary. She stated that she would not be comfortable with the pessary. She is sexually active. We would proceed with testing for urodynamics and plan a surgical repair (likely a cystocele and rectocele repair with cystoscopy). Urodynamics were scheduled today, and a referral was placed for a possible cosurgery with colorectal surgery.     PLAN:  1. Follow up urine culture.  2. Urodynamic studies.  3. Further consultation for surgery.     07/13/2022 - GI:  Discussed results of ARM and defecography. Patient wishes to proceed with endoscopic ultrasound. urogyn consult pending. Will likely need CR surgery input as well.     01/24/2022 - Defecography:  Findings consistent with marked pelvic floor laxity with associated rectal   prolapse and rectal mucosal intussusception.      01/13/2022 - ARM:  Impression:  Reduced squeeze pressure. Inadequate anal sphincter relaxation but patient was able to pass the balloon in 9 seconds. Reduced rectal compliance.   Recommend:  Anal EUS and flex sig to check for anal sphincter injury or proctitis. If no response to laxative therapy, check MR defecography and refer to biofeedback therapy.

## 2023-04-21 ENCOUNTER — Encounter: Admit: 2023-04-21 | Discharge: 2023-04-21 | Payer: MEDICARE

## 2023-04-25 ENCOUNTER — Encounter: Admit: 2023-04-25 | Discharge: 2023-04-25 | Payer: MEDICARE

## 2023-04-25 DIAGNOSIS — E785 Hyperlipidemia, unspecified: Secondary | ICD-10-CM

## 2023-04-25 DIAGNOSIS — M1990 Unspecified osteoarthritis, unspecified site: Secondary | ICD-10-CM

## 2023-04-25 DIAGNOSIS — R002 Palpitations: Secondary | ICD-10-CM

## 2023-04-25 DIAGNOSIS — I10 Essential (primary) hypertension: Secondary | ICD-10-CM

## 2023-04-25 DIAGNOSIS — R159 Full incontinence of feces: Secondary | ICD-10-CM

## 2023-04-25 DIAGNOSIS — R7303 Prediabetes: Secondary | ICD-10-CM

## 2023-04-25 DIAGNOSIS — M8580 Other specified disorders of bone density and structure, unspecified site: Secondary | ICD-10-CM

## 2023-04-25 DIAGNOSIS — E669 Obesity, unspecified: Secondary | ICD-10-CM

## 2023-04-25 DIAGNOSIS — J449 Chronic obstructive pulmonary disease, unspecified: Secondary | ICD-10-CM

## 2023-04-25 DIAGNOSIS — C801 Malignant (primary) neoplasm, unspecified: Secondary | ICD-10-CM

## 2023-04-25 DIAGNOSIS — I499 Cardiac arrhythmia, unspecified: Secondary | ICD-10-CM

## 2023-04-25 NOTE — Progress Notes
PHONE CALL    Name: Shari Spence          MRN: [id number]      DOB: 1936-11-24      AGE: 87 y.o.   DATE OF SERVICE: 04/25/2023    Subjective:             Reason for Visit:  Follow Up      Shari Spence is a 87 y.o. female.      Cancer Staging   No matching staging information was found for the patient.      History of Present Illness  Shari Spence is a 87yo female who returns today for follow up regarding fecal urgency and incontinence which was significantly improved at last visit. She continues to take 1/2 tablet of imodium on Monday and Thursday along with Metamucil everyday. She has not had any leakage or accidents. There is the urge but she can hold it to get to the bathroom. Does not feel that it could be any better.     08/04/2022 [name]:  ASSESSMENT:  1. Defocography showing intussusception, rectal prolapse, and a small anterior rectocele.  2. Pelvic exam today with stage 2 anterior vaginal prolapse (cystocele). well-supported uterus and stage 1 posterior vaginal prolapse (rectocele).  3. Atrophic vulvovaginal tissue consultation  4. Weak pelvic floor  5. 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 I also informed her that, surgically, I do not manage intussusception or rectal prolapse. However, I do surgically or medically manage cystoceles and rectoceles. She was given conservative options of pelvic floor PT or a pessary. She stated that she would not be comfortable with the pessary. She is sexually active. We would proceed with testing for urodynamics and plan a surgical repair (likely a cystocele and rectocele repair with cystoscopy). Urodynamics were scheduled today, and a referral was placed for a possible cosurgery with colorectal surgery.     PLAN:  1. Follow up urine culture.  2. Urodynamic studies.  3. Further consultation for surgery.     07/13/2022 - GI:  Discussed results of ARM and defecography. Patient wishes to proceed with endoscopic ultrasound. urogyn consult pending. Will likely need CR surgery input as well.     01/24/2022 - Defecography:  Findings consistent with marked pelvic floor laxity with associated rectal   prolapse and rectal mucosal intussusception.      01/13/2022 - ARM:  Impression:  Reduced squeeze pressure. Inadequate anal sphincter relaxation but patient was able to pass the balloon in 9 seconds. Reduced rectal compliance.   Recommend:  Anal EUS and flex sig to check for anal sphincter injury or proctitis. If no response to laxative therapy, check MR defecography and refer to biofeedback therapy.     Medical History:   Diagnosis Date    Arrhythmia     past hx per patient.    Arthritis     Cancer (HCC)     skin cancer    COPD (chronic obstructive pulmonary disease) (HCC)     HTN (hypertension)     Hyperlipemia     Obesity     Osteopenia     Palpitations     Pre-diabetes      Surgical History:   Procedure Laterality Date    HX JOINT REPLACEMENT Right 11/2015  right hip     ESOPHAGOGASTRODUODENOSCOPY WITH BIOPSY - FLEXIBLE N/A 10/15/2018    Performed by Virgina Organ, MD at [facility] ENDO    ESOPHAGOGASTRODUODENOSCOPY WITH BIOPSY - FLEXIBLE N/A 01/16/2019    Performed by Normajean Baxter, MD at [facility] ENDO    Left forearm dorsal mass excision Left 10/16/2020    Performed by Letitia Neri, MD at [facility] OR    ANORECTAL MANOMETRY N/A 01/13/2022    Performed by Jolee Ewing, MD at [facility] ENDO    SIGMOIDOSCOPY WITH ENDOSCOPIC ULTRASOUND EXAMINATION - FLEXIBLE N/A 01/02/2023    Performed by Vertell Novak, MD at [facility] OR    CATARACT REMOVAL WITH IMPLANT Bilateral     HX APPENDECTOMY      KNEE SURGERY       Family History   Problem Relation Age of Onset    COPD Mother     Hypertension Sister      Social History     Socioeconomic History    Marital status: Married     Spouse name: Teresita Madura. Frederica Kuster    Number of children: 4    Years of education: 12   Tobacco Use    Smoking status: Never    Smokeless tobacco: Never   Substance and Sexual Activity    Alcohol use: Yes     Comment: rarely    Drug use: No    Sexual activity: Not Currently                        Review of Systems   Constitutional: Negative.  Negative for chills, fever and unexpected weight change.   HENT: Negative.     Eyes: Negative.    Respiratory: Negative.     Cardiovascular: Negative.    Gastrointestinal: Negative.  Negative for abdominal distention, abdominal pain, anal bleeding, blood in stool, constipation, diarrhea, nausea, rectal pain and vomiting.   Endocrine: Negative.    Genitourinary: Negative.    Musculoskeletal: Negative.    Skin: Negative.  Negative for color change and wound.   Allergic/Immunologic: Negative.    Neurological: Negative.  Negative for weakness and light-headedness.   Hematological: Negative.    Psychiatric/Behavioral: Negative.           Objective:          acetaminophen SR (TYLENOL ARTHRITIS PAIN) 650 mg tablet Take one tablet by mouth every 6 hours as needed for Pain.    brimonidine (ALPHAGAN P) 0.15 % ophthalmic solution Apply one drop to both eyes three times daily.    calcium carbonate/vitamin D3 (CALCIUM 600 + D PO) Take 2 tablets by mouth daily.    cholecalciferol (VITAMIN D-3) 5000 unit tablet Take one tablet by mouth every 7 days. Pt taking 50,000    CHOLEcalciferoL (vitamin D3) (OPTIMAL D3) 50,000 units capsule 2,000 mg.    CoQ10 (Ubiquinol) 200 mg cap Take one capsule by mouth daily.    cyanocobalamin (vitamin B-12) 5,000 mcg subl Place one tablet under tongue daily.    docusate (COLACE) 100 mg capsule Take one capsule by mouth daily.    dorzolamide-timoloL (COSOPT) 2-0.5 % ophthalmic solution INSTILL 1 DROP INTO EACH EYE TWICE DAILY    ezetimibe (ZETIA) 10 mg tablet Take one tablet by mouth daily.    Folic Acid 800 mcg tab Take one tablet by mouth daily.    hydroCHLOROthiazide (HYDRODIURIL) 12.5 mg tablet Take one tablet by mouth every morning.    hydrocortisone 2.5 % topical cream  Apply  topically to affected area twice daily.    latanoprost (XALATAN) 0.005 % ophthalmic solution Place one drop into or around eye(s) at bedtime daily.    losartan (COZAAR) 50 mg tablet Take one tablet by mouth daily.    melatonin 10 mg tab Take one tablet by mouth at bedtime daily.    omega-3 fatty acids-vitamin E 1,000 mg cap Take one capsule by mouth daily.    other medication Glucomasine/Chondroitin 1500/200: take 2 tablets by mouth daily    Potassium 99 mg tab Take one tablet by mouth daily.    pravastatin (PRAVACHOL) 10 mg tablet TAKE 1 TABLET BY MOUTH ONCE DAILY AT BEDTIME    vitamins, multi w/minerals 9 mg iron-400 mcg tab Take one tablet by mouth daily.     Vitals:    04/25/23 0945   PainSc: Zero     There is no height or weight on file to calculate BMI.     Pain Score: Zero       Fatigue Scale: 0-None     Physical Exam     Phone call no exam.     Assessment and Plan:  87yo female with FI now resolved with imodium and fiber.     FI:  - Resolved.   - Continue imodium and fiber.     Plan:  - RTC PRN.    Total Time Today was 10 minutes in the following activities: Preparing to see the patient, Counseling and educating the patient/family/caregiver, and Documenting clinical information in the electronic or other health record    .The patient and family were allowed to ask questions and voice concerns; these were addressed to the best of our ability. They expressed understanding of what was explained to them, and they agreed with the present plan. Patient has the phone numbers for the Cancer Center and was instructed on how to contact us with any questions or concerns.    My collaborating provider on this patient is Dr. Deedra Ehrich MD.    Brunilda Payor. Fredricka Bonine MSN, APRN, AGCNS-BC, AGPCNP-BC  Advanced Practice Provider  Colon and Rectal Surgery  Surgical Oncology  APP for Dr. Kathee Delton, Dr. Daphine Deutscher and Dr. Daiva Nakayama  Pager: [phone]  Available via Amie Critchley and [facility] [employer]

## 2023-08-03 ENCOUNTER — Encounter: Admit: 2023-08-03 | Discharge: 2023-08-03 | Payer: MEDICARE

## 2023-08-08 ENCOUNTER — Encounter: Admit: 2023-08-08 | Discharge: 2023-08-08 | Payer: MEDICARE

## 2023-08-08 DIAGNOSIS — R911 Solitary pulmonary nodule: Secondary | ICD-10-CM

## 2023-08-08 NOTE — Telephone Encounter
-----   Message from Lennie Muckle, MD sent at 08/07/2023 10:42 AM CDT -----  Sounds good; Karie Mainland, I will let you take it from here.    TTD  ----- Message -----  From: Eugene Gavia, MD  Sent: 08/06/2023   3:09 PM CDT  To: Clemens Catholic, RN; Nadara Mustard, RN; #    I completely agree. I am happy to see her in clinic with the follow-up scan.     Karie Mainland  ----- Message -----  From: Jeanie Cooks, MD  Sent: 08/03/2023  12:20 PM CDT  To: Eugene Gavia, MD; Clemens Catholic, RN; #    Lurena Joiner,    I think follow up imaging would be medically appropriate.  This is a patient I saw with Dr. 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      We will call her to get the scan scheduled.  I have copied Clemens Catholic here who is our nurse and she can contact the patient and get her scheduled for the follow up scan.    Karie Mainland, in agreement?  If so, Enrique Sack could you arrange for the follow up non con CT?    Thanks    TTD  ----- Message -----  From: Nadara Mustard, RN  Sent: 08/03/2023  10:20 AM CDT  To: Jeanie Cooks, MD    Hello Dr. Otilio Miu-    Ms. Foulkes had recommendations for a follow up chest CT in 12 months. Please let us know if this is needed or if we can assist, thanks!     Please review the attached imaging report, this patient has an overdue follow up imaging recommendation based on findings by the Radiologist. Please respond with one of the following:    I reviewed the chart and the requested follow up imaging/test and...  A. Per clinical scenario/patient discussion, follow up imaging is not medically necessary.  B. Follow up imaging is appropriate, exam has been ordered and clinic personnel will communicate with the patient. (Please provide the clinic personnel's name)  C. Follow up imaging is appropriate, but the patient is no longer under our care. Defer to another physician. (Please provide the name of the physician, if known)  D. This is not my patient. Please defer to the appropriate provider.   E. Follow up recommendation has been completed at another facility. (Please provide the date and name of the facility or document in the EMR)  F. Patient is aware of the findings and has declined further follow up.         Please contact us directly if you have questions:    Nadara Mustard, RN, [place] [address] [place] [address], [place] RN, [place] [address]

## 2023-08-08 NOTE — Telephone Encounter
Order placed for CT Chest and appointment request created.

## 2023-08-09 ENCOUNTER — Encounter: Admit: 2023-08-09 | Discharge: 2023-08-09 | Payer: MEDICARE

## 2023-08-17 ENCOUNTER — Encounter: Admit: 2023-08-17 | Discharge: 2023-08-17 | Payer: MEDICARE

## 2023-08-17 DIAGNOSIS — E782 Mixed hyperlipidemia: Secondary | ICD-10-CM

## 2023-08-17 DIAGNOSIS — I10 Essential (primary) hypertension: Secondary | ICD-10-CM

## 2023-08-17 MED ORDER — PRAVASTATIN 10 MG PO TAB
ORAL_TABLET | ORAL | 0 refills | 90.00000 days | Status: AC
Start: 2023-08-17 — End: ?

## 2023-08-17 NOTE — Progress Notes
Faxed lab orders to [facility]: [phone]     Pt will get labs drawn on 8/26

## 2023-08-21 ENCOUNTER — Encounter: Admit: 2023-08-21 | Discharge: 2023-08-21 | Payer: MEDICARE

## 2023-08-21 DIAGNOSIS — I10 Essential (primary) hypertension: Secondary | ICD-10-CM

## 2023-08-21 DIAGNOSIS — E782 Mixed hyperlipidemia: Secondary | ICD-10-CM

## 2023-08-21 LAB — LIPID PROFILE
CHOLESTEROL: 154 MMOL/L (ref 98–110)
HDL: 54 mg/dL (ref 7–25)
LDL: 78 mg/dL (ref 0.4–1.00)
TRIGLYCERIDES: 110 mg/dL (ref 70–100)
VLDL: 22 mg/dL (ref 8.5–10.6)

## 2023-08-21 LAB — COMPREHENSIVE METABOLIC PANEL
ALBUMIN: 4.1 10*3/uL (ref 0–0.45)
ALK PHOSPHATASE: 40 10*3/uL (ref 0–0.20)
ALT: 16
ANION GAP: 8
AST: 20
BLD UREA NITROGEN: 15 U/L (ref 7–40)
CALCIUM: 9.8 10*3/uL — ABNORMAL HIGH (ref 3–12)
CHLORIDE: 103 g/dL (ref 3.5–5.0)
CO2: 27 U/L (ref 25–110)
CREATININE: 0.8 MMOL/L (ref 21–30)
GFR ESTIMATED: 68
GLUCOSE,PANEL: 98 U/L (ref 7–56)
POTASSIUM: 3.7 mg/dL (ref 0.2–1.3)
SODIUM: 138 g/dL (ref 6.0–8.0)
TOTAL BILIRUBIN: 0.5 10*3/uL (ref 0–0.80)
TOTAL PROTEIN: 7.2 mL/min (ref >60)

## 2023-09-15 ENCOUNTER — Encounter: Admit: 2023-09-15 | Discharge: 2023-09-15 | Payer: MEDICARE

## 2023-10-09 ENCOUNTER — Encounter: Admit: 2023-10-09 | Discharge: 2023-10-09 | Payer: MEDICARE

## 2023-10-09 NOTE — Progress Notes
LVM with radiology scheduling number [phone] to schedule imaging exam  -CT Chest

## 2023-10-31 ENCOUNTER — Ambulatory Visit: Admit: 2023-10-31 | Discharge: 2023-11-01 | Payer: MEDICARE

## 2023-10-31 ENCOUNTER — Encounter: Admit: 2023-10-31 | Discharge: 2023-10-31 | Payer: MEDICARE

## 2023-11-17 ENCOUNTER — Encounter: Admit: 2023-11-17 | Discharge: 2023-11-17 | Payer: MEDICARE

## 2023-11-20 ENCOUNTER — Encounter: Admit: 2023-11-20 | Discharge: 2023-11-20 | Payer: MEDICARE

## 2024-01-29 ENCOUNTER — Encounter: Admit: 2024-01-29 | Discharge: 2024-01-29 | Payer: MEDICARE

## 2024-01-31 ENCOUNTER — Encounter: Admit: 2024-01-31 | Discharge: 2024-01-31 | Payer: MEDICARE

## 2024-04-17 ENCOUNTER — Emergency Department: Admit: 2024-04-17 | Discharge: 2024-04-17 | Payer: MEDICARE | Attending: Cardiovascular Disease

## 2024-04-17 ENCOUNTER — Encounter: Admit: 2024-04-17 | Discharge: 2024-04-17 | Payer: MEDICARE

## 2024-04-17 DIAGNOSIS — R0609 Other forms of dyspnea: Secondary | ICD-10-CM

## 2024-04-17 DIAGNOSIS — R079 Chest pain, unspecified: Secondary | ICD-10-CM

## 2024-04-17 DIAGNOSIS — I340 Nonrheumatic mitral (valve) insufficiency: Secondary | ICD-10-CM

## 2024-04-17 DIAGNOSIS — I10 Essential (primary) hypertension: Secondary | ICD-10-CM

## 2024-04-17 LAB — URINALYSIS DIPSTICK REFLEX TO CULTURE
~~LOC~~ BKR LEUKOCYTES: NEGATIVE
~~LOC~~ BKR NITRITE: NEGATIVE
~~LOC~~ BKR PROTEIN,UA: NEGATIVE /HPF
~~LOC~~ BKR URINE BILE: NEGATIVE
~~LOC~~ BKR URINE BLOOD: NEGATIVE
~~LOC~~ BKR URINE KETONE: NEGATIVE /HPF
~~LOC~~ BKR URINE PH: 7 (ref 5.0–8.0)
~~LOC~~ BKR URINE SPEC GRAVITY: 1 (ref 1.005–1.030)

## 2024-04-17 LAB — KC ED MAIN ECG TRIAGE ONLY
P AXIS: 75 degrees
P-R INTERVAL: 222 ms
Q-T INTERVAL: 372 ms
QRS DURATION: 72 ms
QTC CALCULATION (BAZETT): 498 ms
R AXIS: 24 degrees
T AXIS: 261 degrees
VENTRICULAR RATE: 108 {beats}/min

## 2024-04-17 LAB — ECG 12-LEAD
P AXIS: 79 degrees
P-R INTERVAL: 246 ms
Q-T INTERVAL: 386 ms
QRS DURATION: 80 ms
QTC CALCULATION (BAZETT): 497 ms
R AXIS: 23 degrees
T AXIS: 221 degrees
VENTRICULAR RATE: 100 {beats}/min

## 2024-04-17 LAB — CBC AND DIFF
~~LOC~~ BKR ABSOLUTE BASO COUNT: 0 10*3/uL (ref 0.00–0.20)
~~LOC~~ BKR ABSOLUTE EOS COUNT: 0.1 10*3/uL (ref 0.00–0.45)
~~LOC~~ BKR ABSOLUTE MONO COUNT: 0.4 10*3/uL (ref 0.00–0.80)
~~LOC~~ BKR HEMATOCRIT: 39 % — ABNORMAL LOW (ref 36.0–45.0)
~~LOC~~ BKR MCH: 32 pg — ABNORMAL HIGH (ref 26.0–34.0)
~~LOC~~ BKR MCHC: 34 g/dL — ABNORMAL HIGH (ref 32.0–36.0)
~~LOC~~ BKR MCV: 93 fL (ref 80.0–100.0)
~~LOC~~ BKR MDW (MONOCYTE DISTRIBUTION WIDTH): 20 — ABNORMAL HIGH (ref <=20.6)
~~LOC~~ BKR PLATELET COUNT: 242 10*3/uL (ref 150–400)
~~LOC~~ BKR RBC COUNT: 4.2 10*6/uL (ref 4.00–5.00)
~~LOC~~ BKR RDW: 13 % (ref 11.0–15.0)
~~LOC~~ BKR WBC COUNT: 8.2 10*3/uL (ref 4.50–11.00)

## 2024-04-17 LAB — COMPREHENSIVE METABOLIC PANEL
~~LOC~~ BKR ALBUMIN: 4.5 g/dL — ABNORMAL LOW (ref 3.5–5.0)
~~LOC~~ BKR ALK PHOSPHATASE: 37 U/L (ref 25–110)
~~LOC~~ BKR ALT: 15 U/L (ref 7–56)
~~LOC~~ BKR ANION GAP: 15 10*3/uL — ABNORMAL HIGH (ref 3–12)
~~LOC~~ BKR AST: 26 U/L (ref 7–40)
~~LOC~~ BKR CO2: 22 mmol/L — ABNORMAL HIGH (ref 21–30)
~~LOC~~ BKR SODIUM, SERUM: 135 mmol/L — ABNORMAL LOW (ref 137–147)
~~LOC~~ BKR TOTAL BILIRUBIN: 0.6 mg/dL (ref 0.2–1.3)
~~LOC~~ BKR TOTAL PROTEIN: 7.9 g/dL (ref 6.0–8.0)

## 2024-04-17 LAB — HIGH SENSITIVITY TROPONIN I 2 HOUR
~~LOC~~ BKR HIGH SENSITIVITY TROPONIN I 2 HOUR: 176 ng/L — ABNORMAL HIGH (ref <15)
~~LOC~~ BKR HIGH SENSITIVITY TROPONIN I DELTA VALUE: 17

## 2024-04-17 MED ORDER — MAGNESIUM SULFATE IN D5W 1 GRAM/100 ML IV PGBK
1 g | INTRAVENOUS | 0 refills | Status: CP
Start: 2024-04-17 — End: ?
  Administered 2024-04-18 (×2): 1 g via INTRAVENOUS

## 2024-04-17 MED ORDER — SODIUM CHLORIDE 0.9% IV BOLUS
500 mL | Freq: Once | INTRAVENOUS | 0 refills | Status: DC
Start: 2024-04-17 — End: 2024-04-18

## 2024-04-17 MED ORDER — HEPARIN (PORCINE) BOLUS FOR CONTINUOUS INFUSION (BAG) - APTT MAIN
20-40 [IU]/kg | INTRAVENOUS | 0 refills | Status: DC | PRN
Start: 2024-04-17 — End: 2024-04-19

## 2024-04-17 MED ORDER — IOHEXOL 350 MG IODINE/ML IV SOLN
60 mL | Freq: Once | INTRAVENOUS | 0 refills | Status: CP
Start: 2024-04-17 — End: ?
  Administered 2024-04-18: 01:00:00 60 mL via INTRAVENOUS

## 2024-04-17 MED ORDER — SODIUM CHLORIDE 0.9 % IJ SOLN
50 mL | Freq: Once | INTRAVENOUS | 0 refills | Status: CP
Start: 2024-04-17 — End: ?
  Administered 2024-04-18: 01:00:00 50 mL via INTRAVENOUS

## 2024-04-17 MED ORDER — TIMOLOL MALEATE 0.5 % OP DROP
1 [drp] | Freq: Two times a day (BID) | OPHTHALMIC | 0 refills | Status: DC
Start: 2024-04-17 — End: 2024-04-20
  Administered 2024-04-18 – 2024-04-19 (×2): 1 [drp] via OPHTHALMIC

## 2024-04-17 MED ORDER — PRAVASTATIN 10 MG PO TAB
10 mg | Freq: Every evening | ORAL | 0 refills | Status: DC
Start: 2024-04-17 — End: 2024-04-20
  Administered 2024-04-18 – 2024-04-20 (×3): 10 mg via ORAL

## 2024-04-17 MED ORDER — ACETAMINOPHEN 325 MG PO TAB
650 mg | ORAL | 0 refills | Status: DC | PRN
Start: 2024-04-17 — End: 2024-04-20
  Administered 2024-04-19 (×2): 650 mg via ORAL

## 2024-04-17 MED ORDER — DORZOLAMIDE 2 % OP DROP
1 [drp] | Freq: Two times a day (BID) | OPHTHALMIC | 0 refills | Status: DC
Start: 2024-04-17 — End: 2024-04-20
  Administered 2024-04-18 – 2024-04-19 (×2): 1 [drp] via OPHTHALMIC

## 2024-04-17 MED ORDER — HYDROCHLOROTHIAZIDE 25 MG PO TAB
12.5 mg | Freq: Every morning | ORAL | 0 refills | Status: DC
Start: 2024-04-17 — End: 2024-04-20
  Administered 2024-04-20: 14:00:00 12.5 mg via ORAL

## 2024-04-17 MED ORDER — ASPIRIN 325 MG PO TAB
325 mg | Freq: Once | ORAL | 0 refills | Status: CP
Start: 2024-04-17 — End: ?
  Administered 2024-04-18: 01:00:00 325 mg via ORAL

## 2024-04-17 MED ORDER — HEPARIN (PORCINE) IN 5 % DEX 25,000 UNIT/250 ML(100 UNIT/ML) IV SOLP
0-3000 [IU]/h | INTRAVENOUS | 0 refills | Status: DC
Start: 2024-04-17 — End: 2024-04-19
  Administered 2024-04-18: 21:00:00 486 [IU]/h via INTRAVENOUS
  Administered 2024-04-18: 14:00:00 686 [IU]/h via INTRAVENOUS
  Administered 2024-04-18: 05:00:00 886.8 [IU]/h via INTRAVENOUS
  Administered 2024-04-18: 17:00:00 486 [IU]/h via INTRAVENOUS

## 2024-04-17 MED ORDER — LATANOPROST 0.005 % OP DROP
1 [drp] | Freq: Every evening | OPHTHALMIC | 0 refills | Status: DC
Start: 2024-04-17 — End: 2024-04-20
  Administered 2024-04-20: 02:00:00 1 [drp] via OPHTHALMIC

## 2024-04-17 MED ORDER — HEPARIN (PORCINE) INITIAL BOLUS FOR CONTINUOUS INF (BAG)
70 [IU]/kg | Freq: Once | INTRAVENOUS | 0 refills | Status: CP
Start: 2024-04-17 — End: ?

## 2024-04-17 MED ORDER — POTASSIUM CHLORIDE 20 MEQ PO TBTQ
60 meq | Freq: Once | ORAL | 0 refills | Status: CP
Start: 2024-04-17 — End: ?
  Administered 2024-04-17: 60 meq via ORAL

## 2024-04-17 MED ORDER — MELATONIN 5 MG PO TAB
10 mg | Freq: Every evening | ORAL | 0 refills | Status: DC
Start: 2024-04-17 — End: 2024-04-20
  Administered 2024-04-18 – 2024-04-20 (×3): 10 mg via ORAL

## 2024-04-17 MED ORDER — LOSARTAN 50 MG PO TAB
50 mg | Freq: Every day | ORAL | 0 refills | Status: DC
Start: 2024-04-17 — End: 2024-04-20
  Administered 2024-04-20: 14:00:00 50 mg via ORAL

## 2024-04-17 MED ORDER — BRIMONIDINE 0.2 % OP DROP
1 [drp] | OPHTHALMIC | 0 refills | Status: DC
Start: 2024-04-17 — End: 2024-04-20
  Administered 2024-04-18 – 2024-04-19 (×2): 1 [drp] via OPHTHALMIC

## 2024-04-17 MED ORDER — FAMOTIDINE 20 MG PO TAB
20 mg | Freq: Once | ORAL | 0 refills | Status: CP
Start: 2024-04-17 — End: ?
  Administered 2024-04-18: 01:00:00 20 mg via ORAL

## 2024-04-17 MED ORDER — ENOXAPARIN 40 MG/0.4 ML SC SYRG
40 mg | Freq: Every day | SUBCUTANEOUS | 0 refills | Status: DC
Start: 2024-04-17 — End: 2024-04-18

## 2024-04-17 MED ORDER — EZETIMIBE 10 MG PO TAB
10 mg | Freq: Every day | ORAL | 0 refills | Status: DC
Start: 2024-04-17 — End: 2024-04-20
  Administered 2024-04-19 – 2024-04-20 (×2): 10 mg via ORAL

## 2024-04-17 NOTE — ED Notes
 ED Initial Provider Note:    This patient was seen in the ED triage area to initiate and expedite the patients ED care when possible.    ED Chief Complaint:   Chief Complaint   Patient presents with    Chest Pain     Pt thought she had indigestion Sunday night, intermittent SOA since then, some CP (tightness) today       S: Shari Spence is a 88 y.o. female who presents to the Emergency Department for left-sided chest tightness.  Patient has had some chest pain and shortness of air.  She states chest tightness is worse with walking and exertion.  History of mitral valve insufficiency.    PMHx:  Past Medical History:    Arrhythmia    Arthritis    Cancer (CMS-HCC)    COPD (chronic obstructive pulmonary disease) (CMS-HCC)    HTN (hypertension)    Hyperlipemia    Obesity    Osteopenia    Palpitations    Pre-diabetes       BP (!) 150/85  - Pulse 102  - Temp 36.5 ?C (97.7 ?F)  - Wt 73.9 kg (163 lb)  - SpO2 97%  - BMI 24.78 kg/m?    O: Brief Physical: Alert female resting in chair in no acute distress.  Skin warm and dry no work of breathing.  O2 sat 97 on room air.    A/P: The patient was seen by me as an initial provider in triage. A brief history and physical was obtained. My exam is intended to be an initial medial screening exam. Initial orders have been placed by me. My working diagnosis is ACS, mitral valve insufficiency, COPD.    The patient is deemed appropriate for the main ED. The patient's care will be resumed by the ED provider care team once the patient is roomed in the ED. A more detailed / complete H&P will be documented by those providers.

## 2024-04-17 NOTE — ED Notes
 Pt is Shari Spence 88 yo who came into the ED today with c/o chest pain and shortness of breath. Patient reports this began on Sunday when she was unable to walk her normal route due to SOA. Pt reports dizziness, chest tightness, and lack of energy lately. Pt reports it exacerbates with movement.Pt denies N/V/D, constipation, abdominal pain, and/or fevers/chills. Pt is alert and oriented, breathing is non-labored, skin is appropriate to age and ethnicity. Pt resting in bed, locked in lowest position, side rails up, call light in reach.    Belongings:  jacket, flip flops, jeans

## 2024-04-17 NOTE — H&P (View-Only)
 Inpatient Cardiology History and Physical      Patient's Name:  Shari Spence MRN: [id number]   Today's Date:  04/17/2024  Admission Date: 04/17/2024  LOS: 0 days    Problem list  Principal Problem:    NSTEMI (non-ST elevated myocardial infarction) (CMS-HCC)  Active Problems:    HTN (hypertension)    Nonrheumatic mitral valve regurgitation    Hyperlipemia      Assessment and Plan      Shari Spence is an 88 year old female with past medical history of hypertension, hyperlipidemia, mitral valve insufficiency who presented to [facility] ED on 04/17/2024 for 3-day history of shortness of breath admitted for NSTEMI.     #NSTEMI  #Dyspnea on Exertion  #Mitral Valve Insufficiency  #HTN  #HLD  - 3-day history of acute DOE with improvement with rest.  - CTA chest: No PE. Moderate CA calcifications  - Mild bilateral bronchial wall thickening and scattered mucous plugging with similar distribution though mild increase in clustered nodular and tree-in-bud opacities involving the bilateral upper lobes, likely infectious or inflammatory bronchitis and bronchiolitis.  Consider follow-up CT chest in 6-12 months for reevaluation.   - Development of mild interlobular septal thickening likely reflecting mild interstitial edema. No consolidation or pleural effusion.   - EKG on admission: New T-wave inversions in V4-V6, II, III,   - HS Troponin 159 on presentation w/ peak at 176  - BNP elevated (3,116)  - TTE 03/2022: : LVEF 65%, mild mitral annular calcification without stenosis. Mild mitral valve regurgitation  - Given aspirin 325 mg in ED 04/17/24  Plan  > Patient's history is moderately convincing for ischemia with elevated troponin and new T-wave inversions on EKG. LHC ordered   >  Start heparin gtt  > Repeat TTE  > Cont. PTA pravastatin and ezetimibe   > Cont. PTA HCTZ, losartan    Hypomagnesemia  Hypokalemia  - Presentation to ED - Potassium 3.4, mag 1.5. Given 60 meq PO potassium and 2g IV mag  Plan  > Repeat BMP and mag - replace to keep K. 4, Mg > 2    FEN:  > Diet: DIET NPO AT MIDNIGHT Sips With Medications  > IVF: None  > Monitor and replace electrolytes as needed    VTE ppx: SCDs, Heparin gtt  Code Status:  Full Code    DISPOSITION: Admit to Cardiology - Telemetry    Discussed with Dr. Youlanda Henry.     Burt Casco, MD  Internal Medicine, PGY-3    Subjective:     Chief Complaint:  Shortness of breath    HPI:  Shari Spence is an 88 year old female with history of hypertension hyperlipidemia, mild mitral valve insufficiency presenting for new shortness of breath.  Symptoms began 3 days prior when patient began to feel extremely short of breath when walking a very short distance.  Prior to that she reports good exercise tolerance, often getting up to 10,000 steps per day as recently as last week.  She primarily walks for exercise (laps around her house) and could walk up to 45 minutes without stopping or feeling shortness of breath, chest pain, or chest pressure.  She reports she cannot even walk to the bathroom from her bedroom in her own house without feeling short of breath.  Her shortness of breath is provoked by exercise, and recovers with rest.  She usually recovers within 10 minutes of stopping.  She has had some associated left-sided central chest pressure that does not radiate. These symptoms are  occurring daily with any attempt at ambulation. Patient denies symptoms currently as she is resting in bed. Reports she does not feel SOB while at rest, but in general, does not feel like herself.    She denies fever, chills, cough, body aches, myalgias, N/V, diaphoresis, LE edema.     PCP is Dr. Susan Ensign in [place], [place]    Past medical history:  The patient  has a past medical history of Arrhythmia, Arthritis, Cancer (CMS-HCC), COPD (chronic obstructive pulmonary disease) (CMS-HCC), HTN (hypertension), Hyperlipemia, Obesity, Osteopenia, Palpitations, and Pre-diabetes.    Social history:  - Tobacco: Never smoker  - EtOH: No  - Substances: Denies   - Living: lives in house with her husband, who is present at bedside.   Social History     Tobacco Use    Smoking status: Never    Smokeless tobacco: Never   Substance Use Topics    Alcohol use: Yes     Comment: rarely    Drug use: No       Past surgical history:  The patient  has a past surgical history that includes appendectomy; Upper gastrointestinal endoscopy (N/A, 10/15/2018); Upper gastrointestinal endoscopy (N/A, 01/16/2019); knee surgery; joint replacement (Right, 11/2015); Cataract removal with implant (Bilateral); and sigmoidoscopy (N/A, 01/02/2023).    Family History:  family history includes COPD in her mother; Hypertension in her sister.    Objective:     Vital Signs:  BP (!) 145/65  - Pulse 57  - Temp 36.5 ?C (97.7 ?F)  - Wt 73.9 kg (163 lb)  - SpO2 95%  - BMI 24.78 kg/m?     Physical Exam  Constitutional:       General: She is not in acute distress.  HENT:      Head: Normocephalic and atraumatic.      Mouth/Throat:      Mouth: Mucous membranes are moist.   Eyes:      Extraocular Movements: Extraocular movements intact.      Conjunctiva/sclera: Conjunctivae normal.   Cardiovascular:      Rate and Rhythm: Normal rate and regular rhythm.      Pulses: Normal pulses.      Heart sounds: Normal heart sounds.   Pulmonary:      Effort: Pulmonary effort is normal.      Breath sounds: Normal breath sounds.   Abdominal:      General: There is no distension.      Palpations: Abdomen is soft.      Tenderness: There is no abdominal tenderness.   Musculoskeletal:      Right lower leg: No edema.      Left lower leg: No edema.   Skin:     General: Skin is warm and dry.   Neurological:      Mental Status: She is alert and oriented to person, place, and time.   Psychiatric:         Mood and Affect: Mood normal.         Behavior: Behavior normal.         Labs:  Recent Labs     04/17/24  1518   HGB 13.5   HCT 39.2   WBC 8.20   PLTCT 242   NA 135*   K 3.4*   CL 98   CO2 22   BUN 14   CR 0.84   GLU 171*   CA 10.4   MG 1.5*   ALBUMIN 4.5   TOTPROT 7.9  TOTBILI 0.6   AST 26   ALT 15   ALKPHOS 37   Glucose: (!) 171 (04/17/24 1518)    BNP: 3,116  CARDIAC ENZYMES: 159 -> 176 -> 154    CTA CHEST PULM EMBOLISM W/CONT  Result Date: 04/17/2024  1.  No pulmonary embolism. 2.  Mild bilateral bronchial wall thickening and scattered mucous plugging with similar distribution though mild increase in clustered nodular and tree-in-bud opacities involving the bilateral upper lobes, likely infectious or inflammatory bronchitis and bronchiolitis.  Consider follow-up CT chest in 6-12 months for reevaluation. 3.  Development of mild interlobular septal thickening likely reflecting mild interstitial edema. No consolidation or pleural effusion.  Finalized by Jayden Spencer, M.D. on 04/17/2024 8:10 PM. Dictated by Randye Buttner, M.D. on 04/17/2024 7:54 PM.    CHEST SINGLE VIEW  Result Date: 04/17/2024  No acute cardiopulmonary abnormality.  Finalized by Joy Nipple, M.D. on 04/17/2024 6:07 PM. Dictated by Joy Nipple, M.D. on 04/17/2024 6:06 PM.      Meds:  Scheduled Meds:brimonidine (ALPHAGAN) 0.2 % ophthalmic solution 1 drop, 1 drop, Both Eyes, Q8H  dorzolamide (TRUSOPT) 2 % ophthalmic solution 1 drop, 1 drop, Both Eyes, BID   And  timoloL maleate (TIMOPTIC) 0.5 % ophthalmic drops 1 drop, 1 drop, Both Eyes, BID  [START ON 04/18/2024] ezetimibe (ZETIA) tablet 10 mg, 10 mg, Oral, QDAY  heparin (porcine) INITIAL BOLUS for continuous infusion (bag) 5,173 Units, 70 Units/kg, Intravenous, ONCE  [START ON 04/18/2024] hydroCHLOROthiazide (HYDRODIURIL) tablet 12.5 mg, 12.5 mg, Oral, QAM8  [START ON 04/18/2024] latanoprost (XALATAN) 0.005 % ophthalmic solution 1 drop, 1 drop, Both Eyes, QHS  [START ON 04/18/2024] losartan (COZAAR) tablet 50 mg, 50 mg, Oral, QDAY  melatonin tablet 10 mg, 10 mg, Oral, QHS  pravastatin (PRAVACHOL) tablet 10 mg, 10 mg, Oral, QHS  sodium chloride 0.9% IV bolus 500 mL, 500 mL, Intravenous, ONCE    Continuous Infusions:   heparin (porcine) 25,000 units in dextrose 5% (D5W) 250 mL IV infusion (std conc)       PRN and Respiratory Meds:acetaminophen Q6H PRN, heparin (porcine) TITRATE **AND** [START ON 04/18/2024] heparin (porcine) Q6H PRN

## 2024-04-18 ENCOUNTER — Encounter: Admit: 2024-04-18 | Discharge: 2024-04-18 | Payer: MEDICARE

## 2024-04-18 ENCOUNTER — Inpatient Hospital Stay: Admit: 2024-04-18 | Discharge: 2024-04-18 | Payer: MEDICARE

## 2024-04-18 LAB — COVID INFLUENZA A/B AND RSV PCR

## 2024-04-18 LAB — 2D + DOPPLER ECHO
AORTIC VALVE STROKE VOLUME INDEX: 29
ASCENDING AORTA: 3.3 cm
AV INDEX (NATIVE): 0.9
AV PEAK VELOCITY: 1 m/s
BSA: 1.8 m2
DOP CALC LVOT AREA: 3.4 cm2
DOP CALC LVOT DIAMETER: 2.1 cm
DOP CALC LVOT PEAK VEL VTI: 15 cm
DOP CALC LVOT PEAK VEL: 0.9 m/s — ABNORMAL HIGH (ref 0.5–2.0)
DOP CALC LVOT STROKE VOLUME: 55 cm3
E WAVE DECELARTION TIME: 153 ms
E/A RATIO: 1.2
EJECTION FRACTION: 59 %
FRACTIONAL SHORTENING: 35 % (ref 28–44)
INTERVENTRICULAR SEPTUM: 0.9 cm (ref 0.6–0.9)
LATERAL E/E' RATIO: 10
LEFT ATRIUM SIZE: 3.6 cm (ref 2.7–3.8)
LEFT ATRIUM VOLUME: 32 mL (ref 22–52)
LEFT INTERNAL DIMENSION IN SYSTOLE: 2.6 cm (ref 2.2–3.5)
LEFT VENTRICLE DIASTOLIC VOLUME INDEX: 64 mL/m2 (ref 29–61)
LEFT VENTRICLE DIASTOLIC VOLUME: 120 mL (ref 46–106)
LEFT VENTRICLE MASS INDEX: 58 g/m2 (ref 43–95)
LEFT VENTRICLE SYSTOLIC VOLUME INDEX: 27 mL/m2 (ref 8–24)
LEFT VENTRICLE SYSTOLIC VOLUME: 50 mL (ref 14–42)
LEFT VENTRICULAR MASS: 110 g (ref 67–162)
MEDIAL E/E' RATIO: 18
MV PEAK A VEL: 0.7 m/s
MV PEAK E VEL PW: 0.9 m/s
MV VENA CONTRACTA: 0.7 cm
PISA MRMAX VEL: 5.7 m/s
PISA VN NYQUIST: 0.3 m/s
POSTERIOR WALL: 0.9 cm (ref 0.6–0.9)
PROX AORTA: 3 cm (ref 1.9–3.5)
RA PRESSURE: 3
RELATIVE WALL THICKNESS: 0.4 ng/L — ABNORMAL HIGH (ref <=0.42)
RIGHT ATRIAL AREA: 10 cm2 (ref <18)
RIGHT HEART SYSTOLIC MMODE TAPSE: 2.2 cm (ref >1.7)
RIGHT HEART SYSTOLIC TDI S': 0.1 m/s
RIGHT VENTRICULAR BASAL DIAMETER: 3.9 cm (ref 2.5–4.1)
RIGHT VENTRICULAR MID DIAMETER: 2.7 cm (ref 1.9–3.5)
RV SYSTOLIC PRESSURE: 46
SIMPSONS BIPLANE EF: 58 %
SINUS: 2.8 cm (ref 2.4–3.6)
TDI LATERAL E': 0 m/s
TDI MEDIAL E': 0 m/s
TR PEAK VELOCITY: 3.4 m/s
TV REST PULMONARY ARTERY PRESSURE: 49 mmHg

## 2024-04-18 LAB — BASIC METABOLIC PANEL
~~LOC~~ BKR ANION GAP: 10 (ref 3–12)
~~LOC~~ BKR BLD UREA NITROGEN: 12 mg/dL (ref 7–25)
~~LOC~~ BKR CALCIUM: 9.8 mg/dL (ref 8.5–10.6)
~~LOC~~ BKR CHLORIDE: 103 mmol/L (ref 98–110)
~~LOC~~ BKR CO2: 23 mmol/L (ref 21–30)
~~LOC~~ BKR CREATININE: 0.7 mg/dL (ref 0.40–1.00)
~~LOC~~ BKR GLOMERULAR FILTRATION RATE (GFR): >60 mL/min (ref >60)
~~LOC~~ BKR GLUCOSE, RANDOM: 89 mg/dL (ref 70–100)
~~LOC~~ BKR SODIUM, SERUM: 136 mmol/L — ABNORMAL LOW (ref 137–147)

## 2024-04-18 LAB — MAGNESIUM: ~~LOC~~ BKR MAGNESIUM: 2.1 mg/dL (ref 1.6–2.6)

## 2024-04-18 LAB — PTT (APTT)
~~LOC~~ BKR PTT: 36 s (ref 24.0–36.5)
~~LOC~~ BKR PTT: >20 s — ABNORMAL HIGH (ref 24.0–36.5)
~~LOC~~ BKR PTT: 120 s — ABNORMAL HIGH (ref 24.0–36.5)

## 2024-04-18 LAB — HEPARIN ASSAY (UNFRACTIONATED): ~~LOC~~ BKR HEPARIN ASSAY: 1.1 [IU]/mL — ABNORMAL HIGH (ref 0.30–0.70)

## 2024-04-18 MED ORDER — ASPIRIN 325 MG PO TAB
325 mg | Freq: Once | ORAL | 0 refills | Status: CP
Start: 2024-04-18 — End: ?
  Administered 2024-04-18: 17:00:00 325 mg via ORAL

## 2024-04-18 MED ORDER — PERFLUTREN LIPID MICROSPHERES 1.1 MG/ML IV SUSP
1-10 mL | Freq: Once | INTRAVENOUS | 0 refills | Status: CP | PRN
Start: 2024-04-18 — End: ?
  Administered 2024-04-18: 19:00:00 4 mL via INTRAVENOUS

## 2024-04-18 MED ORDER — SODIUM CHLORIDE 0.9 % IJ SOLN
10 mL | Freq: Once | INTRAVENOUS | 0 refills | Status: CP
Start: 2024-04-18 — End: ?
  Administered 2024-04-18: 19:00:00 10 mL via INTRAVENOUS

## 2024-04-18 MED ORDER — FUROSEMIDE 10 MG/ML IJ SOLN
20 mg | Freq: Once | INTRAVENOUS | 0 refills | Status: DC
Start: 2024-04-18 — End: 2024-04-19

## 2024-04-18 MED ORDER — PANTOPRAZOLE 40 MG PO TBEC
40 mg | Freq: Every day | ORAL | 0 refills | Status: DC
Start: 2024-04-18 — End: 2024-04-20
  Administered 2024-04-19 – 2024-04-20 (×2): 40 mg via ORAL

## 2024-04-18 MED ORDER — SODIUM CHLORIDE 0.9% IV SOLP
1000 mL | INTRAVENOUS | 0 refills | Status: DC
Start: 2024-04-18 — End: 2024-04-19

## 2024-04-18 MED ORDER — FAMOTIDINE 20 MG PO TAB
20 mg | Freq: Once | ORAL | 0 refills | Status: CP
Start: 2024-04-18 — End: ?
  Administered 2024-04-18: 15:00:00 20 mg via ORAL

## 2024-04-18 NOTE — Progress Notes
 Pre-Procedure Airway Assessment     Planned Procedure: LHC, Cor, Possible PCI    Time of last oral intake: >8hrs    Assessment: Airway assessment performed and No abnormalities    Note: If any factors present an anesthesia consult should be considered    Malampatti: III    ASA Class: ASA II (A normal patient with mild systemic disease)    Physician has discussed risks and alternatives of this type sedation and above planned procedure(s) with: Patient    Allergies:   No Active Allergies     Current Medications: Reviewed    Appropriate labs/diagnostic tests: Reviewed    Have the patient or anyone in the patient's family ever had a history of sedation/anesthesia complications? No

## 2024-04-18 NOTE — Progress Notes
 CCU Progress Note      Name: Shari Spence        Birthday: September 04, 1936                                MRN: [id number]    Admission Date: 04/17/2024                                                LOS: 1 day      Brief Hospital Course     Shari Spence is an 88 year old female with past medical history of hypertension, hyperlipidemia, mitral valve insufficiency who presented to [facility] ED on 04/17/2024 for 3-day history of shortness of breath admitted for NSTEMI. Patient noted to also have new EKG changes and thus plan for [facility] 4/24 and Echo.       Assessment & Plan     Principal Problem:    NSTEMI (non-ST elevated myocardial infarction) (CMS-HCC)  Active Problems:    HTN (hypertension)    Nonrheumatic mitral valve regurgitation    Hyperlipemia    #NSTEMI  #Dyspnea on Exertion  #Mitral Valve Insufficiency  #HTN  #HLD  - 3-day history of acute DOE with improvement with rest.  - CTA chest: No PE. Moderate CA calcifications  - Mild bilateral bronchial wall thickening and scattered mucous plugging with similar distribution though mild increase in clustered nodular and tree-in-bud opacities involving the bilateral upper lobes, likely infectious or inflammatory bronchitis and bronchiolitis.  Consider follow-up CT chest in 6-12 months for reevaluation.   - Development of mild interlobular septal thickening likely reflecting mild interstitial edema. No consolidation or pleural effusion.   - EKG on admission: New T-wave inversions in V4-V6, II, III,   - HS Troponin 159 on presentation w/ peak at 176  - BNP elevated (3,116)  - TTE 03/2022: : LVEF 65%, mild mitral annular calcification without stenosis. Mild mitral valve regurgitation  - Given aspirin 325 mg in ED 04/17/24  Plan  > Patient's history is moderately convincing for ischemia with elevated troponin and new T-wave inversions on EKG. LHC ordered, plan for today  > Continue heparin gtt  > Repeat TTE  > Dose of IV lasix 20 mg  > Cont. PTA pravastatin and ezetimibe   > Cont. PTA HCTZ, losartan     #Hx Gastric Ulcer  >Start Protonix 40 mg daily  Hypomagnesemia  Hypokalemia  - Presentation to ED - Potassium 3.4, mag 1.5. Given 60 meq PO potassium and 2g IV mag  Plan  > Repeat BMP and mag - replace to keep K > 4, Mg > 2     FEN:  > Diet: DIET NPO AT MIDNIGHT Sips With Medications  > IVF: None  > Monitor and replace electrolytes as needed     VTE ppx: SCDs, Heparin gtt  Code Status:  Full Code     DISPOSITION: Admit to Cardiology - Telemetry     Patient was discussed with Dr. Randolph Butter, DO  Emergency Medicine PGY-2    _________________________________________________________________________  Subjective     NAEO.  Patient reports continued shortness of breath even when sitting in bed.  Reports some chest discomfort which has been going on for the past 48 hours.  Patient reports that chest pain has not gotten any  worse.  Denies any lower extremity edema, fevers, chills, infectious symptoms.    ROS:   Review of Systems   Constitutional:  Positive for malaise/fatigue. Negative for chills and fever.   HENT:  Negative for congestion.    Respiratory:  Positive for shortness of breath. Negative for cough.    Cardiovascular:  Positive for chest pain. Negative for palpitations and leg swelling.   Gastrointestinal:  Negative for nausea and vomiting.   Genitourinary:  Negative for dysuria and urgency.   Musculoskeletal:  Negative for back pain.   Neurological:  Positive for dizziness and weakness.        The patient  has a past medical history of Arrhythmia, Arthritis, Cancer (CMS-HCC), COPD (chronic obstructive pulmonary disease) (CMS-HCC), HTN (hypertension), Hyperlipemia, Obesity, Osteopenia, Palpitations, and Pre-diabetes.    The patient  has a past surgical history that includes appendectomy; Upper gastrointestinal endoscopy (N/A, 10/15/2018); Upper gastrointestinal endoscopy (N/A, 01/16/2019); knee surgery; joint replacement (Right, 11/2015); Cataract removal with implant (Bilateral); and sigmoidoscopy (N/A, 01/02/2023).    Social History     Tobacco Use    Smoking status: Never    Smokeless tobacco: Never   Substance Use Topics    Alcohol use: Yes     Comment: rarely    Drug use: No     family history includes COPD in her mother; Hypertension in her sister.    Allergies:  Patient has no active allergies.      Objective     Scheduled Meds:brimonidine (ALPHAGAN) 0.2 % ophthalmic solution 1 drop, 1 drop, Both Eyes, Q8H  dorzolamide (TRUSOPT) 2 % ophthalmic solution 1 drop, 1 drop, Both Eyes, BID   And  timoloL maleate (TIMOPTIC) 0.5 % ophthalmic drops 1 drop, 1 drop, Both Eyes, BID  ezetimibe (ZETIA) tablet 10 mg, 10 mg, Oral, QDAY  [Held by Provider] hydroCHLOROthiazide (HYDRODIURIL) tablet 12.5 mg, 12.5 mg, Oral, QAM8  latanoprost (XALATAN) 0.005 % ophthalmic solution 1 drop, 1 drop, Both Eyes, QHS  [Held by Provider] losartan (COZAAR) tablet 50 mg, 50 mg, Oral, QDAY  melatonin tablet 10 mg, 10 mg, Oral, QHS  pravastatin (PRAVACHOL) tablet 10 mg, 10 mg, Oral, QHS  sodium chloride 0.9% IV bolus 500 mL, 500 mL, Intravenous, ONCE    Continuous Infusions:   heparin (porcine) 25,000 units in dextrose 5% (D5W) 250 mL IV infusion (std conc) 886.8 Units/hr (04/17/24 2337)     PRN and Respiratory Meds:acetaminophen Q6H PRN, heparin (porcine) TITRATE **AND** heparin (porcine) Q6H PRN                           Vital Signs: Last Filed                 Vital Signs: 24 Hour Range   BP: 140/65 (04/24 0206)  Temp: 36.5 ?C (97.7 ?F) (04/23 1444)  Pulse: 62 (04/24 0206)  Respirations: 13 PER MINUTE (04/24 0206)  SpO2: 97 % (04/24 0206)  O2 Device: None (Room air) (04/23 1543) BP: (133-153)/(56-85)   Temp:  [36.5 ?C (97.7 ?F)]   Pulse:  [57-102]   Respirations:  [13 PER MINUTE-23 PER MINUTE]   SpO2:  [95 %-98 %]   O2 Device: None (Room air)   Intensity Pain Scale (Self Report): 5 (04/17/24 1442) Vitals:    04/17/24 1444 04/17/24 2300   Weight: 73.9 kg (163 lb) 73.9 kg (162 lb 14.7 oz)         Physical Exam:  Physical Exam  Vitals and nursing note reviewed.   Constitutional:       Appearance: Normal appearance.   HENT:      Head: Normocephalic and atraumatic.      Nose: Nose normal.      Mouth/Throat:      Mouth: Mucous membranes are moist.   Eyes:      Extraocular Movements: Extraocular movements intact.      Pupils: Pupils are equal, round, and reactive to light.   Cardiovascular:      Rate and Rhythm: Normal rate and regular rhythm.      Pulses: Normal pulses.   Pulmonary:      Breath sounds: Rhonchi and rales present.      Comments: Patient stopped to catch breath even with short sentences.  Abdominal:      Palpations: Abdomen is soft.   Musculoskeletal:      Right lower leg: No edema.      Left lower leg: No edema.   Skin:     General: Skin is warm.      Capillary Refill: Capillary refill takes less than 2 seconds.   Neurological:      General: No focal deficit present.      Mental Status: She is alert and oriented to person, place, and time.            Laboratory:  Recent Labs     04/17/24  1518 04/17/24  2332   NA 135* 136*   K 3.4* 4.3   CL 98 103   CO2 22 23   GAP 15* 10   BUN 14 12   CR 0.84 0.77   GLU 171* 89   CA 10.4 9.8   ALBUMIN 4.5  --    MG 1.5* 2.1       Recent Labs     04/17/24  1518 04/17/24  2332 04/18/24  0432   WBC 8.20  --  9.80   HGB 13.5  --  12.6   HCT 39.2  --  36.3   PLTCT 242  --  235   PTT  --  36.4  --    AST 26  --   --    ALT 15  --   --    ALKPHOS 37  --   --       Estimated Creatinine Clearance: 62.3 mL/min (based on SCr of 0.77 mg/dL).  Vitals:    04/17/24 1444 04/17/24 2300   Weight: 73.9 kg (163 lb) 73.9 kg (162 lb 14.7 oz)    No results for input(s): PHART, PO2ART in the last 72 hours.    Invalid input(s): PC02A      Pertinent radiology reviewed.    Malnutrition Details:                                        Active Wounds

## 2024-04-18 NOTE — Consults
 This is administrative note to close Cardiology consult.  Patient admitted to inpatient Cardiology service.  See previous H&P for assessment/plan.

## 2024-04-18 NOTE — Progress Notes
 Pre-Procedure Airway Assessment     Planned Procedure: LHC, CORS    Time of last oral intake: yesterday    Assessment: No abnormalities    Note: If any factors present an anesthesia consult should be considered    Malampatti: III    ASA Class: ASA III (A patient with a severe systemic disease that limits activity, but is not incapacitating)    Physician has discussed risks and alternatives of this type sedation and above planned procedure(s) with: Patient    Allergies:   No Active Allergies     Current Medications: Reviewed    Appropriate labs/diagnostic tests: Reviewed    Have the patient or anyone in the patient's family ever had a history of sedation/anesthesia complications? No    TIMI Risk Score Calculator  [x]  Age >=65  [x]  >=3 CAD risk factors (Hypertension, hypercholesterolemia, diabetes, family history of CAD, or current smoker)  []  Known CAD (stenosis >=50%)  []  ASA use in past 7 days  [x]  Severe angina (>=2 episodes in 24 hrs)  [x]  EKG ST changes >=0.71mm  [x]  Positive Cardiac Biomarker    Each box is 1 point. A score of 2-3 is considered high risk with 8-13% all cause mortality.   [x]  High risk  > 2 (Yes/no)

## 2024-04-18 NOTE — Progress Notes
 Full consult note to follow.    Shari Spence is a pleasant 29F with history HTN, HLD, and a known MR who presented to the ED with 3 day history of SOB. She has been more fatigue lately as well. She is otherwise fairly active 29F.     On exam, alert and oriented. NAD. BMI 25. RRR. No prior sternal incision. LHC was reviewed, 90% ostial LM,  mid RCA 50%, mid Cx 50%. TTE was personally reviewed. EF ~45%, with mid to distal anteroseptal and inferoseptal wall motion abnormality. MR appears moderate to severe, TR moderate to severe. sPAP .      After reviewing the above presentation and imaging, Shari Spence has critical ostial LM disease that is amenable to PCI. I would favor PCI over CABG. Once revascularized successfully, we can monitor her MR and TR which I suspect will improve.  Surgery is still on the table if the PCI didn't work. I think committing her to CABG right now will likely commit her to MV repair/replacement and TV repair which will elevate the STS risk score significantly in this 87y Female.  Patient agreed to proceed with PCI. All of their questions were addressed to their satisfaction.       Thank you for the opportunity to take care of this pleasant patient. Please do not hesitate to contact me with any questions or concerns.    Shari Constant, MD  Assistant Professor  Cardiovascular and Thoracic Surgery  [facility]  [place]  [place], [place]  Office: [phone]  Pager: [phone]  [email or web address]

## 2024-04-18 NOTE — Consults
 Cardiothoracic Surgery Consult  Date of Service: 04/18/2024    Requesting Provider: Dr. Alline Ivans  Consulting Physician: Dr. Melecio Sports Almoghrabi   Consult Performed By:  Austin Blumenthal, PA-C  Primary Cardiology Provider: Dr. Charmel Cooter    HPI:             Shari Spence is a very pleasant 88 year old active female who presented to the emergency room yesterday with severe shortness of breath as well as chest discomfort and fatigue.   She reports over the last few weeks she has noticed an increase in fatigue.  Recently she began having severe shortness of breath, even at rest.  She denied PND, orthopnea or diaphoresis.   Troponin peaked at 176 and BNP on admit was 3,116.  The patient was admitted with a non-ST elevation myocardial infarction and placed on a heparin drip.  CT of the chest on admission was negative for pulmonary embolism.  Echocardiographic demonstrated reduced EF with mitral and tricuspid valve regurgitation.  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    Cardiothoracic consultation has been requested to determine if the patient is a surgical candidate for Coronary artery bypass grafting +/-MVR/TVR.    Cardiac presentation on admission:  NSTEMI    Impression:  Active Hospital Problems    Diagnosis     NSTEMI (non-ST elevated myocardial infarction) (CMS-HCC)     Hyperlipemia      on statin therapy and LDL is not at goal      Nonrheumatic mitral valve regurgitation      Pt had Echo 4/08 demonstrated normal left ventricular systolic function, EF 50%, no valvular abnormalities, no evidence of mitral valve prolapse      HTN (hypertension)      Borderline hypertension - fairly well controlled only with diuretics          Plan:  Dr. Melecio Sports Almoghrabi reviewed all of the findings with the patient, and her family.  He also discussed the risks and benefits of surgery with the compounding risk of concomitant procedures.  PCI is recommended over surgical revascularization.  All questions answered.  The patient and family would like to pursue PCI.     Total Time Today was 60 minutes in the following activities: Preparing to see the patient, Obtaining and/or reviewing separately obtained history, Performing a medically appropriate examination and/or evaluation, Counseling and educating the patient/family/caregiver, Documenting clinical information in the electronic or other health record, and Independently interpreting results (not separately reported) and communicating results to the patient/family/caregiver      Austin Blumenthal, PA-C  Reach me on Voalte or Pager [phone]  at 04/18/2024 5:01 PM     Preliminary STS RISK:   Procedure Type: Isolated CABG  Operative Mortality 8.73%  Morbidity & Mortality 16.6%  Stroke 1.54%  Renal Failure 3.04%  Reoperation 3.15%  Prolonged Ventilation 8.3%  Deep Sternal Wound Infection 0.142%  Long Hospital Stay (>14 days) 9.6%  Short Hospital Stay (<6 days)* 19.3%    Procedure Type: CABG + MVR  Operative Mortality 14.5%  Morbidity & Mortality 38.7%  Stroke 3.69%  Renal Failure 9.39%  Reoperation 7.98%  Prolonged Ventilation 28.3%  Deep Sternal Wound Infection 0.119%  Long Hospital Stay (>14 days) 20.1%  Short Hospital Stay (<6 days)* 4.93%    Procedure Type: CABG + MVR with tricuspid intervention  Operative Mortality 14.5%  Morbidity & Mortality 43%  Stroke 3.69%  Renal Failure 9.39%  Reoperation 7.98%  Prolonged Ventilation  32.6%  Deep Sternal Wound Infection 0.201%  Long Hospital Stay (>14 days) 23.9%  Short Hospital Stay (<6 days)* 3.1%    A note on interpretation of values:  The inherent limitations of statistical risk-adjustment models should be kept in mind when interpreting risk percentage values for an individual patient. Risk adjustment attempts to take into account as many of the patient?s risk factors as possible. However, there are potentially difficult-to-measure factors that are not included in the STS risk-adjustment models and which may increase or decrease a patient?s risk of an adverse outcome.    LEFT HEART CATH FINDINGS:     LM-->  95 % stenosis   CFX-->  50 % stenosis   RCA-->  20 % stenosis    ECHOCARDIOGRAM Findings:  Reduced LV systolic function EF estimated 45%  Wall motion abnormality involving the mid to distal anteroseptal anterior inferoseptal territories as well as the LV.  Differential diagnosis inclusive of ischemia versus possible Takotsubo/stress cardiomyopathy.  Clinical correlation required.  No LV thrombus identified.  RV size and function within normal limits.  Moderate-severe tricuspid valve regurgitation.  Moderate to severe MR.  CVP 3 mmHg.  PA systolic pressure 49 mmHg.    BNP:   NT-Pro-BNP   Date/Time Value Ref Range Status   04/17/2024 03:18 PM 3,116 (H) <450 pg/mL Final       NYHA: III    Past Medical History:    Arrhythmia    Arthritis    Cancer (CMS-HCC)    COPD (chronic obstructive pulmonary disease) (CMS-HCC)    HTN (hypertension)    Hyperlipemia    Obesity    Osteopenia    Palpitations    Pre-diabetes       Surgical History:   Procedure Laterality Date    HX JOINT REPLACEMENT Right 11/2015    right hip     ESOPHAGOGASTRODUODENOSCOPY WITH BIOPSY - FLEXIBLE N/A 10/15/2018    Performed by Rachael Budd, MD at [facility] ENDO    ESOPHAGOGASTRODUODENOSCOPY WITH BIOPSY - FLEXIBLE N/A 01/16/2019    Performed by Romualdo Cobble, MD at [facility] ENDO    Left forearm dorsal mass excision Left 10/16/2020    Performed by Laurelyn Ponder, MD at [facility]2 OR    ANORECTAL MANOMETRY N/A 01/13/2022    Performed by Dearl Fabry, MD at [facility] ENDO    SIGMOIDOSCOPY WITH ENDOSCOPIC ULTRASOUND EXAMINATION - FLEXIBLE N/A 01/02/2023    Performed by Karalee Oscar, MD at [facility] OR    CATARACT REMOVAL WITH IMPLANT Bilateral     HX APPENDECTOMY      KNEE SURGERY          Medications Prior to Admission   Medication Sig    acetaminophen SR (TYLENOL ARTHRITIS PAIN) 650 mg tablet Take one tablet by mouth every 6 hours as needed for Pain.    brimonidine (ALPHAGAN P) 0.15 % ophthalmic solution Apply one drop to both eyes three times daily.    calcium carbonate/vitamin D3 (CALCIUM 600 + D PO) Take 1 tablet by mouth twice daily.    CHOLEcalciferoL (vitamin D3) (OPTIMAL D3) 50,000 units capsule Take one capsule by mouth every 7 days.    CoQ10 (Ubiquinol) 200 mg cap Take one capsule by mouth daily.    cyanocobalamin (vitamin B-12) 5,000 mcg subl Place one tablet under tongue daily.    dorzolamide-timoloL (COSOPT) 2-0.5 % ophthalmic solution INSTILL 1 DROP INTO EACH EYE TWICE DAILY    ezetimibe (ZETIA) 10 mg tablet Take one tablet by mouth daily.  Folic Acid 800 mcg tab Take one tablet by mouth daily.    glucosam/chon-msm1/C/mang/bosw (GLUCOSAMINE-CHONDROITIN 3X PO) Take 1 capsule by mouth daily.    hydroCHLOROthiazide (HYDRODIURIL) 12.5 mg tablet Take one tablet by mouth every morning.    latanoprost (XALATAN) 0.005 % ophthalmic solution Apply one drop to both eyes at bedtime daily.    loperamide (IMODIUM) 2 mg tablet Take one-half tablet by mouth every 48 hours.    losartan (COZAAR) 50 mg tablet Take one tablet by mouth daily.    melatonin 10 mg tab Take one tablet by mouth at bedtime daily.    omega-3 fatty acids-vitamin E 1,000 mg cap Take one capsule by mouth daily.    other medication Mary Ruth's Liquid Multivitamin + Hair Growth formula: take 30 mL by mouth once daily    Potassium 99 mg tab Take one tablet by mouth daily.    pravastatin (PRAVACHOL) 10 mg tablet Take one tablet by mouth at bedtime daily.    psyllium husk (METAMUCIL) 3.4 gram/5.4 gram powder Take 3.4 g by mouth daily.        No Active Allergies    Family History   Problem Relation Name Age of Onset    COPD Mother      Hypertension Sister         Social History     Socioeconomic History    Marital status: Married     Spouse name: Aubery Blare. Angelita Kendall    Number of children: 4    Years of education: 12   Tobacco Use    Smoking status: Never    Smokeless tobacco: Never Substance and Sexual Activity    Alcohol use: Yes     Comment: rarely    Drug use: No    Sexual activity: Not Currently       ROS:  Constitutional: + Fatigue  Eyes, Ears, Nose And Throat: Negative for Change in vision, Change in Hearing   Cardiovascular: Negative for Chest Pain, Palpitations, Swelling in ankles  Respiratory: + severe shortness of breath- even at rest  Gastrointestinal: Negative for Nausea, indigestion, Diarrhea, Constipation, Rectal Bleeding   Neurological: Negative for Headache, Memory problems, Numbness, Muscle Weakness  Psychological: Negative for depression or anxiety  Musculoskeletal: Negative for Pain or Swelling in joints  Genitourinary: Negative for Pain with urination, Incontinence of urine, urinary frequency    Skin: Negative for any unusual rash  Endocrine: Negative for any hair, skin, or nail changes, Unusual hunger or thirst    Physical Exam:  Temp: 36.6 ?C (97.8 ?F) (04/24 1354)  Pulse: 74 (04/24 1630)  Respirations: 19 PER MINUTE (04/24 1630)  BP: 135/69 (04/24 1630)      GENERAL:   A&O x 3, NAD, appears stated age  HEENT:  Head is normocephalic,atraumatic  Eyes are normal, pupils size normal and round/reactive  Mouth appears normal with no lesions, teeth present and in good repair  NECK:  No masses, normal ROM, trachea midline  CARDIOVASCULAR:  No JVD ,No bruits  RRR, no rub, + murmur  No lower extremity edema  LUNGS:  Clear to auscultation bilaterally, chest symmetrical with respirations  ABDOMEN:  Soft, NT, + BS  SKIN:   No lesions, rashes or wounds  MUSCULOSKELETAL:  No muscle atrophy, normal joint range of motion   NEUROLOGIC:  Grossly intact     Results for orders placed or performed during the hospital encounter of 04/17/24 (from the past 24 hours)   HIGH SENSITIVITY TROPONIN I 2 HOUR  Collection Time: 04/17/24  5:15 PM   # # Low-High    hs Troponin I 2 Hour 176 (H) <15 ng/L    hs Troponin I 2-0hr Delta Value 17    HIGH SENSITIVITY TROPONIN I 4 HR    Collection Time: 04/17/24  7:04 PM   # # Low-High    hs Troponin I 4 Hour 154 (H) <15 ng/L    hs Troponin I 4-2hr Delta Value -22    PTT (APTT)    Collection Time: 04/17/24 11:32 PM   # # Low-High    APTT 36.4 24.0 - 36.5 Seconds   BASIC METABOLIC PANEL    Collection Time: 04/17/24 11:32 PM   # # Low-High    Sodium 136 (L) 137 - 147 mmol/L    Potassium 4.3 3.5 - 5.1 mmol/L    Chloride 103 98 - 110 mmol/L    Glucose 89 70 - 100 mg/dL    Blood Urea Nitrogen 12 7 - 25 mg/dL    Creatinine 8.29 5.62 - 1.00 mg/dL    Calcium 9.8 8.5 - 13.0 mg/dL    CO2 23 21 - 30 mmol/L    Anion Gap 10 3 - 12    Glomerular Filtration Rate (GFR) >60 >60 mL/min   MAGNESIUM    Collection Time: 04/17/24 11:32 PM   # # Low-High    Magnesium 2.1 1.6 - 2.6 mg/dL   HEMOGLOBIN Q6V    Collection Time: 04/18/24  4:32 AM   # # Low-High    Hemoglobin A1C 5.7 4.0 - 5.7 %   LIPID PROFILE    Collection Time: 04/18/24  4:32 AM   # # Low-High    Cholesterol 129 <200 mg/dL    Triglycerides 89 <784 mg/dL    HDL 56 >69 mg/dL    LDL 64 <629 mg/dL    VLDL 52.8 mg/dL    Non HDL Cholesterol 73 mg/dL   CBC    Collection Time: 04/18/24  4:32 AM   # # Low-High    White Blood Cells 9.80 4.50 - 11.00 10*3/uL    Red Blood Cells 3.99 (L) 4.00 - 5.00 10*6/uL    Hemoglobin 12.6 12.0 - 15.0 g/dL    Hematocrit 41.3 24.4 - 45.0 %    MCV 91.0 80.0 - 100.0 fL    MCH 31.5 26.0 - 34.0 pg    MCHC 34.6 32.0 - 36.0 g/dL    RDW 01.0 27.2 - 53.6 %    Platelet Count 235 150 - 400 10*3/uL    MPV 8.4 7.0 - 11.0 fL   PTT (APTT)    Collection Time: 04/18/24  5:32 AM   # # Low-High    APTT >200.0 (HH) 24.0 - 36.5 Seconds   HEPARIN ASSAY (UNFRACTIONATED)    Collection Time: 04/18/24  6:56 AM   # # Low-High    Heparin Assay 1.15 (H) 0.30 - 0.70 [IU]/mL   PTT (APTT)    Collection Time: 04/18/24  8:59 AM   # # Low-High    APTT 120.0 (H) 24.0 - 36.5 Seconds   COVID INFLUENZA A/B AND RSV PCR    Collection Time: 04/18/24  9:02 AM    Specimen: Nasal; Swab   # # Low-High    Influenza A Virus Not Detected Not Detected, Test Invalid    Influenza B Virus Not Detected Not Detected, Test Invalid    RSV PCR Not Detected Not Detected, Test Invalid    COVID-19 (SARS-CoV-2) PCR Not Detected  Not Detected

## 2024-04-19 ENCOUNTER — Encounter: Admit: 2024-04-19 | Discharge: 2024-04-19 | Payer: MEDICARE

## 2024-04-19 ENCOUNTER — Inpatient Hospital Stay: Admit: 2024-04-19 | Discharge: 2024-04-19 | Payer: MEDICARE

## 2024-04-19 LAB — ECG 12-LEAD
P AXIS: 59 degrees
P-R INTERVAL: 200 ms
Q-T INTERVAL: 427 ms
QRS DURATION: 90 ms
QTC CALCULATION (BAZETT): 455 ms
R AXIS: 39 degrees
T AXIS: 115 degrees
VENTRICULAR RATE: 68 {beats}/min

## 2024-04-19 LAB — POC ACTIVATED CLOTTING TIME (ISTAT): ~~LOC~~ BKR POCT ACT ISTAT: 285 s

## 2024-04-19 LAB — PTT (APTT)
~~LOC~~ BKR PTT: 72 s — ABNORMAL HIGH (ref 24.0–36.5)
~~LOC~~ BKR PTT: 58 s — ABNORMAL HIGH (ref 24.0–36.5)

## 2024-04-19 MED ORDER — ASPIRIN 81 MG PO CHEW
81 mg | Freq: Every day | ORAL | 0 refills | Status: DC
Start: 2024-04-19 — End: 2024-04-20
  Administered 2024-04-20: 14:00:00 81 mg via ORAL

## 2024-04-19 MED ORDER — PATIENTS OWN MEDICATION
1 | 0 refills | Status: DC
Start: 2024-04-19 — End: 2024-04-20

## 2024-04-19 MED ORDER — ONDANSETRON HCL (PF) 4 MG/2 ML IJ SOLN
4 mg | INTRAVENOUS | 0 refills | Status: DC | PRN
Start: 2024-04-19 — End: 2024-04-20

## 2024-04-19 MED ORDER — MAGNESIUM SULFATE IN D5W 1 GRAM/100 ML IV PGBK
1 g | INTRAVENOUS | 0 refills | Status: CP
Start: 2024-04-19 — End: ?
  Administered 2024-04-19: 13:00:00 1 g via INTRAVENOUS

## 2024-04-19 MED ORDER — DIPHENHYDRAMINE HCL 25 MG PO CAP
25 mg | ORAL | 0 refills | Status: DC | PRN
Start: 2024-04-19 — End: 2024-04-20

## 2024-04-19 MED ORDER — TICAGRELOR 90 MG PO TAB
90 mg | Freq: Two times a day (BID) | ORAL | 0 refills | Status: DC
Start: 2024-04-19 — End: 2024-04-20
  Administered 2024-04-20: 14:00:00 90 mg via ORAL

## 2024-04-19 MED ORDER — SODIUM CHLORIDE 0.9% IV SOLP
INTRAVENOUS | 0 refills | Status: AC
Start: 2024-04-19 — End: ?

## 2024-04-19 MED ORDER — POTASSIUM CHLORIDE 20 MEQ PO TBTQ
40 meq | Freq: Once | ORAL | 0 refills | Status: CP
Start: 2024-04-19 — End: ?
  Administered 2024-04-19: 13:00:00 40 meq via ORAL

## 2024-04-19 MED ORDER — ASPIRIN 325 MG PO TAB
325 mg | Freq: Once | ORAL | 0 refills | Status: CP
Start: 2024-04-19 — End: ?
  Administered 2024-04-19: 13:00:00 325 mg via ORAL

## 2024-04-19 MED ORDER — FAMOTIDINE 20 MG PO TAB
20 mg | Freq: Once | ORAL | 0 refills | Status: CP
Start: 2024-04-19 — End: ?
  Administered 2024-04-19: 13:00:00 20 mg via ORAL

## 2024-04-19 MED ORDER — DIPHENHYDRAMINE HCL 50 MG/ML IJ SOLN
25 mg | INTRAVENOUS | 0 refills | Status: DC | PRN
Start: 2024-04-19 — End: 2024-04-20

## 2024-04-19 NOTE — Progress Notes
 CARDIOPULMONARY REHABILITATION  INPATIENT ASSESSMENT    Cardiac Rehabilitation Staff: Rhunette Cha Discharge Date:     Demographics  Pre-admit Dx: Other (comment) (SOA) Date of Admission: 04/17/2024     Room: HC2 CVLAB RM/HC2 CVLAB BD DOB:  28-Oct-1936   Insurance: Primary: Medicare  Secondary: N/a   Address: [address]  [place] [address]   Patient Phone:  [phone] (home)        ED Contact: Brynda Cara- Spouse  ED Phone #: [phone]   CTS: N/A Cardiologist: Charmel Cooter     Cardiac Procedures and Events         PCI: 04/19/24  MI/STEMI: 04/18/24 (NSTEMI)                Risk Factors  Risk Factors: Hypertension, Hyperlipidemia  BP: 135/68  Height: 172.7 cm (5' 7.99)  Weight: 71.7 kg (158 lb)  BMI (Calculated): 24.64      Medical History   has a past medical history of Arrhythmia, Arthritis, Cancer (CMS-HCC), COPD (chronic obstructive pulmonary disease) (CMS-HCC), HTN (hypertension), Hyperlipemia, Obesity, Osteopenia, Palpitations, and Pre-diabetes.    Labs  Cholesterol   Date Value Ref Range Status   04/18/2024 129 <200 mg/dL Final     Triglycerides   Date Value Ref Range Status   04/18/2024 89 <150 mg/dL Final     HDL   Date Value Ref Range Status   04/18/2024 56 >40 mg/dL Final     LDL   Date Value Ref Range Status   04/18/2024 64 <100 mg/dL Final     Hemoglobin M5H   Date Value Ref Range Status   04/18/2024 5.7 4.0 - 5.7 % Final     Comment:     The ADA recommends that most patients with type 1 and type 2 diabetes maintain an A1c level <7%.         Heart Resource Manual Given: 04/19/24     Teaching Completed: 04/19/24     Outpatient Cardiopulmonary Rehabilitation    Outpatient Cardic Rehab: Yes    [facility]    [phone]   Date Faxed: 04/19/24    [facility]    [phone]    If [place], Sent to Staff:            Brook Canterbury  04/19/2024

## 2024-04-19 NOTE — Progress Notes
 PHYSICAL THERAPY  NOTE      Name: Shari Spence   MRN: [id number]     DOB: [birth year]      Age: 88 y.o.  Admission Date: 04/17/2024     LOS: 2 days     Date of Service: 04/19/2024      Per discussion with OT, patient reports no concerns with mobility with no PT goals identified. PT will discontinue service, please re-consult if the patient has a decline in functional status.      Therapist: Adeeb Konecny, PT  Date: 04/19/2024

## 2024-04-19 NOTE — Case Management (ED)
 Case Management Admission Assessment    NAME:Shari Spence                          MRN: [id number]             DOB:06-Feb-1936          AGE: 88 y.o.  ADMISSION DATE: 04/17/2024             DAYS ADMITTED: LOS: 2 days      Today?s Date: 04/19/2024    Source of Information: This CM met with pt and her husband, Raenette Bumps  for assessment on this date.  Provided contact information and explanation of SW/NCM roles.  Reviewed [facility], Preparing for Discharge, and [facility].  Provided opportunity for questions and discussion. Pt/family encouraged to contact Case Management team with questions and concerns during hospitalization and until patient is able to transition back to the patient's primary care physician.  -Patient and her husband live in their home in [place], [place].  Patient is independent with no history of DME use.  She had HH in the past.         Plan  Plan: Assist PRN with SW/NCM Services:  CM team will follow for d/c needs.      Patient Address/Phone  [address]  [place] [address]  [phone] (home)     Emergency Contact  Extended Emergency Contact Information  Primary Emergency Contact: Thedford,Bill  Mobile Phone: [phone]  Relation: Spouse  Secondary Emergency Contact: Weber,Jeanne   [place]   Home Phone: [phone]  Relation: Daughter    Healthcare Directive  Healthcare Directive: Yes, patient has a healthcare directive  Type of Healthcare Directive: Durable power of attorney for healthcare, Healthcare directive  Location of Healthcare Directive: Current and verified in document scanning system  Would patient like to fill out a (a new) Healthcare Directive?: No, patient declined  Psych Advance Directive (Psych unit only): No, patient does not have a Social research officer, government  Does the Patient Need Case Management to Arrange Discharge Transport? (ex: facility, ambulance, wheelchair/stretcher, Medicaid, cab, other): No  Will the Patient Use Family Transport?: Yes  Transportation Name, Phone and Availability #1: husband, Raenette Bumps    Expected Discharge Date  04/20/2024     Living Situation Prior to Admission  Living Arrangements  Type of Residence: Home, independent  Living Arrangements: Spouse/significant other  Financial risk analyst / Tub: Psychologist, counselling  How many levels in the residence?: 1  Can patient live on one level if needed?: Yes  Does residence have entry and/or inside stairs?: No  Assistance needed prior to admit or anticipated on discharge: No  Level of Function   Prior level of function: Independent  Cognitive Abilities   Cognitive Abilities: Alert and Oriented, Engages in problem solving and planning, Participates in Radio producer Resources  Coverage  Primary Insurance: Medicare  Secondary Insurance: Medicare Supplement  Additional Coverage: None  Medication Coverage    Medication Coverage: Medicare Part D  Medicare Part D Plan: [employer]  Have you experienced a noticeable increase in your copay costs recently?: No  Are current medications affordable?: Yes  Do You Use a Co-Pay Card or a Medication Assistance Program to Help Manage Medication Costs?: No  Do You Manage Your Own Medications?: Yes  Source of Income   Source Of Income: SSI  Financial Assistance Needed?  no    Psychosocial Needs  Mental Health  Mental Health History: No  Substance Use History  Substance Use History Screen: No  Other  no    Current/Previous Services  PCP  Susan Ensign, [phone], [phone]  Pharmacy    [facility] [address], [place] [address]  [address]  [place] [address]  Phone: [phone] Fax: [phone]    Durable Medical Equipment   Durable Medical Equipment at home: None  Home Health  Receiving home health: In the past  Agency name: [facility] in [place]  Would patient use this agency again?: Yes  Hemodialysis or Peritoneal Dialysis  Undergoing hemodialysis or peritoneal dialysis: No  Tube/Enteral Feeds  Receive tube/enteral feeds: No  Infusion  Receive infusions: No  Private Duty  Private duty help used: No  Home and Community Based Services  Home and community based services: No  Ryan White  Ryan White: N/A  Hospice  Hospice: No  Outpatient Therapy  PT: No  OT: No  SLP: No  Skilled Nursing Facility/Nursing Home  SNF: No  NH: No  Inpatient Rehab  IPR: No  Long-Term Acute Care Hospital  LTACH: No  Acute Hospital Stay  Acute Hospital Stay: In the past  Was patient's stay within the last 30 days?: No      Lincoln Renshaw, CCM  Integrated Case Manager  *[phone]

## 2024-04-19 NOTE — Progress Notes
 HC5 END OF SHIFT/PLAN OF CARE NURSING NOTE   Nursing Shift: Night Shift 1900-0700    Acute events, nursing interventions, & communication with providers: No acute events. Heparin gtt therapeutic. NPO for possible PCI.       Patient Goal(s)  Patient will Report progressive increase in activity tolerance by the end of next shift.        Patient will  Maintain stable fluid volume with clear breath sounds and vital signs within normal limit by discharge.   Admission Weight: Weight: 73.9 kg (163 lb)    Last 3 Weights:   Vitals:    04/17/24 2300 04/18/24 0926 04/19/24 0453   Weight: 73.9 kg (162 lb 14.7 oz) 73.5 kg (162 lb) 71.7 kg (158 lb)     Weight Change: Weight trend stable    Intake/Output Summary (Last 24 hours) at 04/19/2024 0626  Last data filed at 04/19/2024 0527  Gross per 24 hour   Intake 100 ml   Output 0 ml   Net 100 ml          Fluid Restriction? No   Quality/Safety    Total Fall Risk Score: 6   Risk for Injury related to falls: Age > 85 years, Coagulopathies/risk for bleed, and Surgery/procedure within the last 24 hours  Fall Risk Category:   History of More Than One Fall Within 6 Months Before Admission: No  Elimination, Bowel and Urine: N/A  Interventions: N/A - Does not score for risk in this category  Medications: On 1 high fall risk drug  Interventions: Educate patient on medication side effects  Patient Care Equipment: N/A  Interventions: N/A - Does not score as risk in this category  Mobility: 0 - No mobility issues  Interventions: N/A - Does not score as risk in this category  Cognition: 0 - No cognition issues  Interventions: N/A - Does not score as risk in this category    Other safety precautions in place: N/A    Restraints:  No      Patient Education  This RN provided education to Patient today. The following education topics were reviewed:  Quality/Safety Education:   Fall risk and Pain scale  Medication Education:   Medication management (Indication, adverse effects, monitoring, etc)  Education provided on the following medication(s): Heparin  Cardiac - Specific Education:   Cardiac diagnosis specific education: PCI  General Education:   Tests/Procedures: PCI    The following teaching method(s) were used: Verbal  Response to learning: [employer]

## 2024-04-19 NOTE — Progress Notes
 04/19/24 1443   Current Cardiac Procedures/Events   Pre Admit Dx Other (comment)  (SOA)   PCI 04/19/24   MI/STEMI 04/18/24  (NSTEMI)   Risk Factors   Risk Factors Hypertension;Hyperlipidemia   Education   Person Instructed Patient;Spouse   Patient Barriers To Learning None Noted   Interventions/Teaching Methods Written Materials Provided;Verbal Instructions   Patient Response Verbalized Understanding   Topics Angina and NTG use;Wound Care;Reinforcement of Medications - Anti-Platelet;Reinforcement of Medications - NTG;Reasons to call your doctor;Risk Factor Management - Blood Pressure;Outpatient Cardiac Rehab Referral Information;Home Walking Guidelines;Risk Factor Management - Physical Inactivity;Risk Factor Management - Cholesterol   Teaching Completed 04/19/24   Heart Resource Manual Given 04/19/24   Comments Pt educated and provided CR and discharge material. Pt interested in participating in CR in [place]. Pt agreeable to allow me to send over her referral. All questions answered at this time.   Outpatient Cardiopulmonary Rehab   OPCR Yes   [phone]   Date Faxed 04/19/24

## 2024-04-19 NOTE — Progress Notes
 OCCUPATIONAL THERAPY  NOTE   Name: Shari Spence   MRN: [id number]     DOB: [birth year]      Age: 88 y.o.  Admission Date: 04/17/2024     LOS: 2 days     Date of Service: 04/19/2024    Occupational thearpy orders received and appreciated. Chart reviewed and discussed patient status with bedside RN. Per discussion with RN, patient currently completing ADLs and functional mobility at baseline and without concern. Plans to get a PCI today and RN reports stairs are the only patient concern and the RN informed me that nursing will do stairs with patient tomorrow prior to DC, which should improve pt symptoms after PCI. Patient's current functional status and level of safety suggests that progression of activity can be achieved with nursing and/or family and does not require skilled occupational therapy intervention. Occupational therapy services will be discontinued at this time, please reconsult if a change in functional status occurs.         Therapist: Marisela Sicks, OT  Date: 04/19/2024

## 2024-04-19 NOTE — Patient Education
 On 04/19/2024, Shari Spence was counseled and provided with a list of her discharge medications as part of her After Visit Summary.  The patient was instructed to bring this medication list to her next doctor's appointment and to update the list with any medication changes.  Where indicated, the patient was provided with additional medication and/or disease-state information.    All patient questions were answered and patient acknowledged understanding of the medications, side effects, and other pertinent medication information.    [name]  04/19/2024

## 2024-04-19 NOTE — Progress Notes
 CCU Progress Note      Name: Shari Spence        Birthday: [birth year]                                MRN: [id number]    Admission Date: 04/17/2024                                                LOS: 2 days      Brief Hospital Course     Charice Kagle is an 88 year old female with past medical history of hypertension, hyperlipidemia, mitral valve insufficiency who presented to [facility] ED on 04/17/2024 for 3-day history of shortness of breath admitted for NSTEMI. Patient noted to also have new EKG changes and thus plan for [facility] 4/24 and Echo. 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 No stenting was preformed during initial cath and patient was evaluated by CTS for possible CABG. Patient was ultimately not the best candidate for CABG and plan for PCI of left main.      Assessment & Plan     Principal Problem:    NSTEMI (non-ST elevated myocardial infarction) (CMS-HCC)  Active Problems:    HTN (hypertension)    Nonrheumatic mitral valve regurgitation    Hyperlipemia    #NSTEMI  #Dyspnea on Exertion  #Mitral Valve Insufficiency    - 3-day history of acute DOE with improvement with rest.  - CTA chest: No PE. Moderate CA calcifications  - EKG on admission: New T-wave inversions in V4-V6, II, III,   - HS Troponin 159 on presentation w/ peak at 176  - BNP elevated (3,116)  - TTE 03/2022:  LVEF 65%, mild mitral annular calcification without stenosis. Mild mitral valve regurgitation  - TTE 04/19/24: LVEF 45%, Wall motion abnormality of mid to distal anteroseptal, anterior inferoseptal. Moderate-severe tricuspid valve regurgitation. Moderate to severe MR.   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   > CTS consulted for CABG, recommend PCI at this time and to hold off on CABG, PCI planned for 4/25  > Continue heparin gtt until after PCI and then likely DAPT      #HTN  #HLD  > Cont. PTA pravastatin and ezetimibe   > Hold. PTA HCTZ, losartan    #Hx Gastric Ulcer  >Start Protonix 40 mg daily      Hypomagnesemia  Hypokalemia  Plan  > Repeat BMP and mag - replace to keep K > 4, Mg > 2     FEN:  > Diet: DIET NPO AT MIDNIGHT Sips With Medications  > IVF: None  > Monitor and replace electrolytes as needed     VTE ppx: SCDs, Heparin gtt  Code Status:  Full Code     DISPOSITION: Admit to Cardiology - Telemetry     Patient was discussed with Dr. Randolph Butter, DO  Emergency Medicine PGY-2    _________________________________________________________________________  Subjective     NAEO. Patient reports SOB on exertion still, especially with ambulation. Denies any CP, edema or other symptoms at this time.     ROS:   Review of Systems   Constitutional:  Positive for malaise/fatigue. Negative for chills and fever.   HENT:  Negative for congestion.    Respiratory:  Positive for shortness of breath. Negative for cough.    Cardiovascular:  Negative for chest pain, palpitations and leg swelling.   Gastrointestinal:  Negative for nausea and vomiting.   Genitourinary:  Negative for dysuria and urgency.   Musculoskeletal:  Negative for back pain.   Neurological:  Positive for dizziness and weakness.        The patient  has a past medical history of Arrhythmia, Arthritis, Cancer (CMS-HCC), COPD (chronic obstructive pulmonary disease) (CMS-HCC), HTN (hypertension), Hyperlipemia, Obesity, Osteopenia, Palpitations, and Pre-diabetes.    The patient  has a past surgical history that includes appendectomy; Upper gastrointestinal endoscopy (N/A, 10/15/2018); Upper gastrointestinal endoscopy (N/A, 01/16/2019); knee surgery; joint replacement (Right, 11/2015); Cataract removal with implant (Bilateral); and sigmoidoscopy (N/A, 01/02/2023).    Social History     Tobacco Use    Smoking status: Never    Smokeless tobacco: Never   Substance Use Topics    Alcohol use: Yes     Comment: rarely    Drug use: No     family history includes COPD in her mother; Hypertension in her sister.    Allergies:  Patient has no active allergies.      Objective     Scheduled Meds:brimonidine (ALPHAGAN) 0.2 % ophthalmic solution 1 drop, 1 drop, Both Eyes, Q8H  dorzolamide (TRUSOPT) 2 % ophthalmic solution 1 drop, 1 drop, Both Eyes, BID   And  timoloL maleate (TIMOPTIC) 0.5 % ophthalmic drops 1 drop, 1 drop, Both Eyes, BID  ezetimibe (ZETIA) tablet 10 mg, 10 mg, Oral, QDAY  furosemide (LASIX) injection 20 mg, 20 mg, Intravenous, ONCE  [Held by Provider] hydroCHLOROthiazide (HYDRODIURIL) tablet 12.5 mg, 12.5 mg, Oral, QAM8  latanoprost (XALATAN) 0.005 % ophthalmic solution 1 drop, 1 drop, Both Eyes, QHS  [Held by Provider] losartan (COZAAR) tablet 50 mg, 50 mg, Oral, QDAY  melatonin tablet 10 mg, 10 mg, Oral, QHS  pantoprazole DR (PROTONIX) tablet 40 mg, 40 mg, Oral, QDAY(21)  pravastatin (PRAVACHOL) tablet 10 mg, 10 mg, Oral, QHS  sodium chloride 0.9% infusion, 1,000 mL, Intravenous, As Prescribed    Continuous Infusions:   heparin (porcine) 25,000 units in dextrose 5% (D5W) 250 mL IV infusion (std conc) 486 Units/hr (04/19/24 0547)     PRN and Respiratory Meds:acetaminophen Q6H PRN, heparin (porcine) TITRATE **AND** heparin (porcine) Q6H PRN                           Vital Signs: Last Filed                 Vital Signs: 24 Hour Range   BP: 134/58 (04/25 0341)  Temp: 36.4 ?C (97.6 ?F) (04/25 0341)  Pulse: 80 (04/25 0453)  Respirations: 18 PER MINUTE (04/25 0341)  SpO2: 96 % (04/25 0341)  O2 Device: None (Room air) (04/25 0341)  Height: 172.7 cm (5' 7.99) (04/24 0926) BP: (101-170)/(39-86)   Temp:  [36.4 ?C (97.6 ?F)-36.6 ?C (97.9 ?F)]   Pulse:  [57-87]   Respirations:  [9 PER MINUTE-23 PER MINUTE]   SpO2:  [96 %-98 %]   O2 Device: None (Room air)   Intensity Pain Scale (Self Report): 3 (04/18/24 1405) Vitals:    04/17/24 2300 04/18/24 0926 04/19/24 0453   Weight: 73.9 kg (162 lb 14.7 oz) 73.5 kg (162 lb) 71.7 kg (158 lb)  Physical Exam:  Physical Exam  Vitals and nursing note reviewed.   Constitutional:       Appearance: Normal appearance.   HENT:      Head: Normocephalic and atraumatic.      Nose: Nose normal.      Mouth/Throat:      Mouth: Mucous membranes are moist.   Eyes:      Extraocular Movements: Extraocular movements intact.      Pupils: Pupils are equal, round, and reactive to light.   Cardiovascular:      Rate and Rhythm: Normal rate and regular rhythm.      Pulses: Normal pulses.   Pulmonary:      Breath sounds: Rhonchi and rales present.      Comments: Patient stopped to catch breath even with short sentences.  Abdominal:      Palpations: Abdomen is soft.   Musculoskeletal:      Right lower leg: No edema.      Left lower leg: No edema.   Skin:     General: Skin is warm.      Capillary Refill: Capillary refill takes less than 2 seconds.   Neurological:      General: No focal deficit present.      Mental Status: She is alert and oriented to person, place, and time.            Laboratory:  Recent Labs     04/17/24  1518 04/17/24  2332 04/18/24  0432 04/19/24  0336   NA 135* 136*  --  137   K 3.4* 4.3  --  3.7   CL 98 103  --  104   CO2 22 23  --  22   GAP 15* 10  --  11   BUN 14 12  --  11   CR 0.84 0.77  --  0.72   GLU 171* 89  --  87   CA 10.4 9.8  --  9.2   ALBUMIN 4.5  --   --   --    MG 1.5* 2.1  --  1.7   HGBA1C  --   --  5.7  --        Recent Labs     04/17/24  1518 04/17/24  2332 04/18/24  0432 04/18/24  0532 04/18/24  0859 04/18/24  2222 04/19/24  0336 04/19/24  0445   WBC 8.20  --  9.80  --   --   --  9.60  --    HGB 13.5  --  12.6  --   --   --  12.5  --    HCT 39.2  --  36.3  --   --   --  36.2  --    PLTCT 242  --  235  --   --   --  215  --    PTT  --  36.4  --  >200.0* 120.0* 72.1*  --  58.8*   AST 26  --   --   --   --   --   --   --    ALT 15  --   --   --   --   --   --   --    ALKPHOS 37  --   --   --   --   --   --   --       Estimated Creatinine Clearance: 64.7 mL/min (based on SCr  of 0.72 mg/dL).  Vitals:    04/17/24 2300 04/18/24 0926 04/19/24 0453   Weight: 73.9 kg (162 lb 14.7 oz) 73.5 kg (162 lb) 71.7 kg (158 lb)    No results for input(s): PHART, PO2ART in the last 72 hours.    Invalid input(s): PC02A      Pertinent radiology reviewed.    Malnutrition Details:                                        Active Wounds

## 2024-04-19 NOTE — Progress Notes
 Pre-Procedure Airway Assessment     Planned Procedure: PCI LM    Time of last oral intake: prior to MN    Assessment: No abnormalities    Note: If any factors present an anesthesia consult should be considered    [place]: II    ASA Class: ASA II (A normal patient with mild systemic disease)    Physician has discussed risks and alternatives of this type sedation and above planned procedure(s) with: Patient, husbsand  Utilizing a shared joint decision process, we have agreed to proceed with  percutaneous coronary intervention. The risks, benefits, and alternatives to this approach were discussed at length. The risks, which include, but are not limited to death, MI, stroke, emergent cardiovascular surgery, renal failure, dialysis, vascular complications, bleeding, blood transfusions, and infection were discussed. The patient verbalized understanding and stated the desire to proceed. All questions were answered.    Allergies:   No Active Allergies     Current Medications: Reviewed    Appropriate labs/diagnostic tests: Reviewed    Have the patient or anyone in the patient's family ever had a history of sedation/anesthesia complications? No

## 2024-04-19 NOTE — Progress Notes
 HC5 END OF SHIFT/PLAN OF CARE NURSING NOTE   Nursing Shift: Day Shift 0700-1900    Acute events, nursing interventions, & communication with providers: Pt went down for high risk PCI, successful stent placed to LAD. Pt recovered well, R radial site shows bruising. Plan is to dc home tomorrow.      Patient Goal(s)  Patient will Be able to ambulate without breathing difficulty by the end of next shift.        Patient will  Verbalize readiness for discharge by discharge.   Admission Weight: Weight: 73.9 kg (163 lb)    Last 3 Weights:   Vitals:    04/17/24 2300 04/18/24 0926 04/19/24 0453   Weight: 73.9 kg (162 lb 14.7 oz) 73.5 kg (162 lb) 71.7 kg (158 lb)     Weight Change: Weight trend stable    Intake/Output Summary (Last 24 hours) at 04/19/2024 1805  Last data filed at 04/19/2024 1800  Gross per 24 hour   Intake 150 ml   Output 0 ml   Net 150 ml     Last Bowel Movement Date:  (PTA)    Fluid Restriction? No   Quality/Safety    Total Fall Risk Score: 10   Risk for Injury related to falls: Surgery/procedure within the last 24 hours  Fall Risk Category:   History of More Than One Fall Within 6 Months Before Admission: No  Elimination, Bowel and Urine: N/A  Interventions: N/A - Does not score for risk in this category  Medications: Sedated procedure within past 24 hours  Interventions: N/A - Does not score as risk in this category  Patient Care Equipment: N/A  Interventions: N/A - Does not score as risk in this category  Mobility: 0 - No mobility issues  Interventions: N/A - Does not score as risk in this category  Cognition: 0 - No cognition issues  Interventions: N/A - Does not score as risk in this category    Other safety precautions in place: N/A    Restraints:  No      Patient Education  This RN provided education to Patient, Family, and Significant Other today. The following education topics were reviewed:  Quality/Safety Education:   Fall risk  Medication Education:   Medication management (Indication, adverse effects, monitoring, etc)  Education provided on the following medication(s): heparin gtt  Cardiac - Specific Education:   Post cardiac procedure care: PCI  General Education:   Discharge planning    The following teaching method(s) were used: Verbal  Response to learning: [employer] Understanding  Needs reinforcement on: N/A

## 2024-04-19 NOTE — Progress Notes
 Date of Service: 04/25/2024    Shari Spence is a 88 y.o. female.       HPI     I had the pleasure of seeing Shari Spence today as part of post PCI follow-up after a left main artery stenting performed by Dr. Daryll Epp on 4/25.  She follows with Dr. Harlee Lichtenstein from [facility] cardiology.    Shari Spence presented to [facility] ED on 4/23 after a 3-day shortness of breath episode, was admitted for NSTEMI with new EKG changes and recommended for left heart catheterization 4/24.  Heart cath showed 90% stenosis of left main with new apical wall motion abnormalities on transthoracic echo and worsening MR she was evaluated by CTS surgery and felt to be prohibitively high risk, and ultimately underwent intervention with Dr. Daryll Epp on 4/25.    Since her stenting and discharge from hospitalization she reports to me she continues to note ongoing shortness of breath and her general activity level has not resumed what it was previously.  She utilized a wheelchair to get to the office today which her husband reports is very unusual for her as she was previously completing 10,000 steps a day or walking a mile without stopping without significant limitation.  We discussed that part of her symptoms are likely related to the Brilinta side effect, and we discussed transitioning to another P2 Y12 inhibitor though she was told Dr. Daryll Epp really wanted her to be on Brilinta for at least a month and she was willing to complete this medication for that long prior to transitioning.  There is no doubt that part of her shortness of breath is related to her mitral insufficiency, which we discussed we will routinely follow with echo as noted at time of discharge from the hospital.    In addition to the above noted shortness of breath she has been dealing with generalized fatigue some postural dizziness and has noted at home heart rates in the 40s since starting the metoprolol.    She denies chest discomfort, dyspnea with exertion, PND, orthopnea, edema, palpitations, dizziness, syncope, bleeding tendencies, fevers/chills or sweats or changes in bowel or bladder function.     Medical Diagnoses:  Coronary artery disease s/p left main stent placement with Dr. Daryll Epp on 4/25 (4.0 x 18 mm drug-eluting Xience stent. (Post dilated with a 4.0 x 8 mm Sapphire NC balloon up to 20 atmospheres) done via R Radial access - initially Brilinta/asa, transitioned to Plavix/asa due to cost  HFrEF- LVEF 30-35% on echo from 03/2024  Mitral insufficiency, severe on echo from 4/25- recommended serial 1-2 month echo for monitoring   Dyslipidemia-LDL 64 on rosuvastatin 40 mg and Zetia 10 mg daily  Hypertension         Vitals:    04/25/24 1055   BP: 132/72   BP Source: Arm, Left Upper   Pulse: 52   SpO2: 99%   PainSc: Zero   Weight: 72.1 kg (159 lb)   Height: 172.7 cm (5' 8)     Body mass index is 24.18 kg/m?Shari Spence     Review of Systems:  Constitution: Negative.   HEENT: Negative.    Cardiovascular: as in HPI, otherwise negative  Respiratory: as in HPI, otherwise negative.    Endocrine: Negative.    Hematologic/Lymphatic: Negative.    Skin: Negative.    Musculoskeletal: Negative.    Gastrointestinal: Negative.    Genitourinary: Negative.    Neurological: Negative.    Psychiatric/Behavioral: Negative.    Allergic/Immunologic: Negative.  Physical Exam:  Gen: appears stated age, in no acute distress  Head: normocephalic, atraumatic  Eyes: sclera non-icteric, EOMs intact   Mouth: mucous membranes are moderately moist  Neck: no JVD, no carotid bruits auscultated  Lungs: CTA bilaterally without rales or rhonchi  Heart: RRR without murmur or gallop appreciated  Abdomen: soft, nontender, bowel sounds present  Extremities: no lower extremity edema, no cyanosis, pedal pulses are intact. Feet are warm, with preserved capillary refill.  Skin: warm and dry  Neurological: A&Ox3, no focal deficits noted  Psychiatric: calm, pleasant, and cooperative  Access site: Her right radial artery has a strong, 2+ pulse distal to access site insertion with marked bruising noted on the palmar aspect, posterior aspect, and somewhat into her mid forearm, it is not painful, and she reports this bruising is not significantly changed since discharge    Cardiovascular Studies      Cardiovascular Health Factors  Vitals BP Readings from Last 3 Encounters:   04/25/24 132/72   04/20/24 (!) 152/62   10/31/23 (!) 144/82     Wt Readings from Last 3 Encounters:   04/25/24 72.1 kg (159 lb)   04/20/24 72.5 kg (159 lb 12.8 oz)   10/31/23 75.3 kg (166 lb)     BMI Readings from Last 3 Encounters:   04/25/24 24.18 kg/m?   04/20/24 24.30 kg/m?   10/31/23 25.24 kg/m?      Smoking Social History     Tobacco Use   Smoking Status Never   Smokeless Tobacco Never      Lipid Profile Cholesterol   Date Value Ref Range Status   04/18/2024 129 <200 mg/dL Final     HDL   Date Value Ref Range Status   04/18/2024 56 >40 mg/dL Final     LDL   Date Value Ref Range Status   04/18/2024 64 <100 mg/dL Final     Triglycerides   Date Value Ref Range Status   04/18/2024 89 <150 mg/dL Final      Blood Sugar Hemoglobin A1C   Date Value Ref Range Status   04/18/2024 5.7 4.0 - 5.7 % Final     Comment:     The ADA recommends that most patients with type 1 and type 2 diabetes maintain an A1c level <7%.     Glucose   Date Value Ref Range Status   04/20/2024 90 70 - 100 mg/dL Final   56/21/3086 87 70 - 100 mg/dL Final   57/84/6962 89 70 - 100 mg/dL Final     Glucose, POC   Date Value Ref Range Status   01/02/2023 117 (A) 70 - 100 Final   01/02/2023 102 (A) 70 - 100 Final          Problems Addressed Today  Encounter Diagnoses   Name Primary?    NSTEMI (non-ST elevated myocardial infarction) (CMS-HCC) Yes    Primary hypertension     Status post insertion of drug-eluting stent into left anterior descending (LAD) artery for coronary artery disease     Nonrheumatic mitral valve regurgitation     Mixed hyperlipidemia        Assessment and Plan     Coronary artery disease  - She underwent stenting of a 90% ostial left main with Dr. Daryll Epp on 4/25, residual disease is a 50% OM1, 20% stenosis of distal RCA  -Recommended continuing Brilinta for the remainder of the month and then transitioning to Plavix as planned, she has home paperwork detailing this transition which  we reviewed today   - Patient for at least 1 year in the setting of NSTEMI presentation  -Continue aggressive risk factor modification as detailed below  - She is currently scheduled to see her primary cardiologist, Dr. Harlee Lichtenstein on 11/04/2024    Mitral valve insufficiency  -Transthoracic echo on 04/19/2024 showed moderate to severe MR  - We will complete repeat echocardiogram for evaluation after her recent PCI in approximately 1 month,  - I have requested a follow-up visit with me to review the results of her echo which I will go over with her primary cardiologist    Atherosclerotic risk factor modification  -Today in clinic her blood pressure is 132/72, currently on metoprolol 25 mg daily, spironolactone 12.5 mg daily, losartan 100 mg daily  -The metoprolol and spironolactone were new medications for her, she has had symptomatic bradycardia last couple of days, we will cut the metoprolol dosing in half and reevaluate at her follow-up visit  -Does not/has not ever used tobacco  -Her most recent lipid panel included:  Lab Results   Component Value Date    LDL 64 04/18/2024    HDL 56 04/18/2024    CHOL 161 04/18/2024    TRIG 89 04/18/2024    AST 26 04/17/2024    ALT 15 04/17/2024     - Continue current lipid management treatment  -Denies diabetes diagnosis, last A1c is 5.7, discussed dietary and activity modifications to help maintain tight glycemic control  - No appreciable underlying chronic kidney disease on recent lab work          Current Medications (including today's revisions)   acetaminophen SR (TYLENOL ARTHRITIS PAIN) 650 mg tablet Take one tablet by mouth every 6 hours as needed for Pain.    aspirin 81 mg chewable tablet Chew one tablet by mouth daily. Indications: treatment to prevent a heart attack    brimonidine (ALPHAGAN P) 0.15 % ophthalmic solution Apply one drop to both eyes three times daily.    calcium carbonate/vitamin D3 (CALCIUM 600 + D PO) Take 1 tablet by mouth twice daily.    CHOLEcalciferoL (vitamin D3) (OPTIMAL D3) 50,000 units capsule Take one capsule by mouth every 7 days.    [START ON 05/20/2024] clopiDOGreL (PLAVIX) 75 mg tablet On day 1 of clopidogrel (at least 12 hours from last Brilinta dose), take eight tablets once daily THEN on day 2 and thereafter, take one tablet by mouth daily. To be taken after 1 month use of Brilinta. Brilinta for 30 days then 11 months of clopidogrel/plavix.  Indications: treatment to prevent a heart attack (Patient not taking: Reported on 04/25/2024)    CoQ10 (Ubiquinol) 200 mg cap Take one capsule by mouth daily.    cyanocobalamin (vitamin B-12) 5,000 mcg subl Place one tablet under tongue daily.    dorzolamide-timoloL (COSOPT) 2-0.5 % ophthalmic solution INSTILL 1 DROP INTO EACH EYE TWICE DAILY    ezetimibe (ZETIA) 10 mg tablet Take one tablet by mouth daily.    Folic Acid 800 mcg tab Take one tablet by mouth daily.    glucosam/chon-msm1/C/mang/bosw (GLUCOSAMINE-CHONDROITIN 3X PO) Take 1 capsule by mouth daily.    latanoprost (XALATAN) 0.005 % ophthalmic solution Apply one drop to both eyes at bedtime daily.    loperamide (IMODIUM) 2 mg tablet Take one-half tablet by mouth every 48 hours.    losartan (COZAAR) 50 mg tablet Take one tablet by mouth daily.    melatonin 10 mg tab Take one tablet by mouth at bedtime daily.  metoprolol succinate XL (TOPROL XL) 25 mg extended release tablet Take one-half tablet by mouth daily. Indications: myocardial reinfarction prevention    omega-3 fatty acids-vitamin E 1,000 mg cap Take one capsule by mouth daily.    other medication Mary Ruth's Liquid Multivitamin + Hair Growth formula: take 30 mL by mouth once daily    pantoprazole DR (PROTONIX) 40 mg tablet Take one tablet by mouth daily. Indications: a stomach ulcer    Potassium 99 mg tab Take one tablet by mouth daily.    psyllium husk (METAMUCIL) 3.4 gram/5.4 gram powder Take 3.4 g by mouth daily.    rosuvastatin (CRESTOR) 20 mg tablet Take one tablet by mouth daily. Indications: excessive fat in the blood    spironolactone (ALDACTONE) 25 mg tablet Take one-half tablet by mouth daily. Take with food.  Indications: heart failure with reduced ejection fraction    ticagrelor (BRILINTA) 90 mg tablet Take one tablet by mouth twice daily. Indications: a sudden worsening of angina called acute coronary syndrome     Total Time Today was 45 minutes in the following activities: Preparing to see the patient, Obtaining and/or reviewing separately obtained history, Performing a medically appropriate examination and/or evaluation, Counseling and educating the patient/family/caregiver, Ordering medications, tests, or procedures, and Documenting clinical information in the electronic or other health record

## 2024-04-20 ENCOUNTER — Inpatient Hospital Stay
Admit: 2024-04-17 | Discharge: 2024-04-20 | Disposition: A | Discharge status: Home / Self Care | Payer: MEDICARE | Attending: Cardiovascular Disease | Admitting: Cardiovascular Disease

## 2024-04-20 DIAGNOSIS — Z8711 Personal history of peptic ulcer disease: Secondary | ICD-10-CM

## 2024-04-20 DIAGNOSIS — J449 Chronic obstructive pulmonary disease, unspecified: Secondary | ICD-10-CM

## 2024-04-20 DIAGNOSIS — I491 Atrial premature depolarization: Secondary | ICD-10-CM

## 2024-04-20 DIAGNOSIS — I440 Atrioventricular block, first degree: Secondary | ICD-10-CM

## 2024-04-20 DIAGNOSIS — Z8249 Family history of ischemic heart disease and other diseases of the circulatory system: Secondary | ICD-10-CM

## 2024-04-20 DIAGNOSIS — Z9049 Acquired absence of other specified parts of digestive tract: Secondary | ICD-10-CM

## 2024-04-20 DIAGNOSIS — Z825 Family history of asthma and other chronic lower respiratory diseases: Secondary | ICD-10-CM

## 2024-04-20 DIAGNOSIS — Z961 Presence of intraocular lens: Secondary | ICD-10-CM

## 2024-04-20 DIAGNOSIS — Z1152 Encounter for screening for COVID-19: Secondary | ICD-10-CM

## 2024-04-20 DIAGNOSIS — I361 Nonrheumatic tricuspid (valve) insufficiency: Secondary | ICD-10-CM

## 2024-04-20 DIAGNOSIS — Z7409 Other reduced mobility: Secondary | ICD-10-CM

## 2024-04-20 DIAGNOSIS — I5022 Chronic systolic (congestive) heart failure: Secondary | ICD-10-CM

## 2024-04-20 DIAGNOSIS — E876 Hypokalemia: Secondary | ICD-10-CM

## 2024-04-20 DIAGNOSIS — I2510 Atherosclerotic heart disease of native coronary artery without angina pectoris: Secondary | ICD-10-CM

## 2024-04-20 DIAGNOSIS — M8580 Other specified disorders of bone density and structure, unspecified site: Secondary | ICD-10-CM

## 2024-04-20 DIAGNOSIS — Z85828 Personal history of other malignant neoplasm of skin: Secondary | ICD-10-CM

## 2024-04-20 DIAGNOSIS — Z96641 Presence of right artificial hip joint: Secondary | ICD-10-CM

## 2024-04-20 DIAGNOSIS — I493 Ventricular premature depolarization: Secondary | ICD-10-CM

## 2024-04-20 DIAGNOSIS — I214 Non-ST elevation (NSTEMI) myocardial infarction: Secondary | ICD-10-CM

## 2024-04-20 DIAGNOSIS — R7303 Prediabetes: Secondary | ICD-10-CM

## 2024-04-20 DIAGNOSIS — I110 Hypertensive heart disease with heart failure: Secondary | ICD-10-CM

## 2024-04-20 DIAGNOSIS — E782 Mixed hyperlipidemia: Secondary | ICD-10-CM

## 2024-04-20 DIAGNOSIS — I3481 Nonrheumatic mitral (valve) annulus calcification: Secondary | ICD-10-CM

## 2024-04-20 DIAGNOSIS — I255 Ischemic cardiomyopathy: Secondary | ICD-10-CM

## 2024-04-20 DIAGNOSIS — Z79899 Other long term (current) drug therapy: Secondary | ICD-10-CM

## 2024-04-20 MED ORDER — MAGNESIUM SULFATE IN D5W 1 GRAM/100 ML IV PGBK
1 g | INTRAVENOUS | 0 refills | Status: CP
Start: 2024-04-20 — End: ?
  Administered 2024-04-20: 11:00:00 1 g via INTRAVENOUS

## 2024-04-20 MED ORDER — SPIRONOLACTONE 25 MG PO TAB
12.5 mg | Freq: Every day | ORAL | 0 refills | Status: DC
Start: 2024-04-20 — End: 2024-04-20
  Administered 2024-04-20: 18:00:00 12.5 mg via ORAL

## 2024-04-20 MED ORDER — SPIRONOLACTONE 25 MG PO TAB
12.5 mg | ORAL_TABLET | Freq: Every day | ORAL | 1 refills | 90.00000 days | Status: AC
Start: 2024-04-20 — End: ?

## 2024-04-20 MED ORDER — METOPROLOL SUCCINATE 25 MG PO TB24
25 mg | ORAL_TABLET | Freq: Every day | ORAL | 1 refills | 90.00000 days | Status: DC
Start: 2024-04-20 — End: 2024-04-25

## 2024-04-20 MED ORDER — TICAGRELOR 90 MG PO TAB
90 mg | ORAL_TABLET | Freq: Two times a day (BID) | ORAL | 0 refills | 30.00000 days | Status: AC
Start: 2024-04-20 — End: ?

## 2024-04-20 MED ORDER — POTASSIUM CHLORIDE 20 MEQ PO TBTQ
40 meq | Freq: Once | ORAL | 0 refills | Status: CP
Start: 2024-04-20 — End: ?
  Administered 2024-04-20: 11:00:00 40 meq via ORAL

## 2024-04-20 MED ORDER — TICAGRELOR 90 MG PO TAB
90 mg | ORAL_TABLET | Freq: Two times a day (BID) | ORAL | 1 refills | 30.00000 days | Status: DC
Start: 2024-04-20 — End: 2024-04-20

## 2024-04-20 MED ORDER — PANTOPRAZOLE 40 MG PO TBEC
40 mg | ORAL_TABLET | Freq: Every day | ORAL | 1 refills | 90.00000 days | Status: AC
Start: 2024-04-20 — End: ?

## 2024-04-20 MED ORDER — METOPROLOL SUCCINATE 25 MG PO TB24
25 mg | Freq: Every day | ORAL | 0 refills | Status: DC
Start: 2024-04-20 — End: 2024-04-20
  Administered 2024-04-20: 18:00:00 25 mg via ORAL

## 2024-04-20 MED ORDER — ROSUVASTATIN 20 MG PO TAB
20 mg | ORAL_TABLET | Freq: Every day | ORAL | 1 refills | 90.00000 days | Status: AC
Start: 2024-04-20 — End: ?

## 2024-04-20 MED ORDER — MAGNESIUM SULFATE IN D5W 1 GRAM/100 ML IV PGBK
1 g | INTRAVENOUS | 0 refills | Status: CP
Start: 2024-04-20 — End: ?
  Administered 2024-04-20: 15:00:00 1 g via INTRAVENOUS

## 2024-04-20 MED ORDER — ASPIRIN 81 MG PO CHEW
81 mg | ORAL_TABLET | Freq: Every day | ORAL | 1 refills | 30.00000 days | Status: AC
Start: 2024-04-20 — End: ?

## 2024-04-20 MED ORDER — CLOPIDOGREL 75 MG PO TAB
75 mg | ORAL_TABLET | Freq: Every day | ORAL | 1 refills | 90.00000 days | Status: DC
Start: 2024-04-20 — End: 2024-04-20

## 2024-04-20 MED ORDER — ROSUVASTATIN 20 MG PO TAB
20 mg | Freq: Every day | ORAL | 0 refills | Status: DC
Start: 2024-04-20 — End: 2024-04-20

## 2024-04-20 MED ORDER — CLOPIDOGREL 75 MG PO TAB
ORAL_TABLET | ORAL | 1 refills | 90.00000 days | Status: AC
Start: 2024-04-20 — End: ?

## 2024-04-20 NOTE — Progress Notes
 CCU Progress Note      Name: Shari Spence        Birthday: Feb 19, 1936                                MRN: [id number]    Admission Date: 04/17/2024                                                LOS: 3 days      Brief Hospital Course     Shari Spence is an 88 year old female with past medical history of hypertension, hyperlipidemia, mitral valve insufficiency who presented to [facility] ED on 04/17/2024 for 3-day history of shortness of breath admitted for NSTEMI. Patient noted to also have new EKG changes and thus plan for [facility] 4/24 and Echo. 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 No stenting was preformed during initial cath and patient was evaluated by CTS for possible CABG. Patient was ultimately not the best candidate for CABG and plan for PCI of left main. S/p PCI on 4/25 w stent placed. Will start on DAPT + PPI and add on Rosuvastatin 20 for high dose statin and dc pravastatin.      Assessment & Plan     Principal Problem:    NSTEMI (non-ST elevated myocardial infarction) (CMS-HCC)  Active Problems:    HTN (hypertension)    Nonrheumatic mitral valve regurgitation    Hyperlipemia    #NSTEMI  #Dyspnea on Exertion  #Mitral Valve Insufficiency    - 3-day history of acute DOE with improvement with rest.  - CTA chest: No PE. Moderate CA calcifications  - EKG on admission: New T-wave inversions in V4-V6, II, III,   - HS Troponin 159 on presentation w/ peak at 176  - BNP elevated (3,116)  - TTE 03/2022:  LVEF 65%, mild mitral annular calcification without stenosis. Mild mitral valve regurgitation  - TTE 04/19/24: LVEF 45%, Wall motion abnormality of mid to distal anteroseptal, anterior inferoseptal. Moderate-severe tricuspid valve regurgitation. Moderate to severe MR.   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   > CTS consulted for CABG, recommend PCI at this time and to hold off on CABG, PCI planned for 4/25  > Cont DAPT (Ticagrelor and ASA) for 1 yr w Protonix      #HTN  #HLD  > Cont. PTA ezetimibe, start Rosuvastatin 20 and stop pravastatin do have high dose statin   > Stop. PTA HCTZ, switch to spironolactone 12.5  > Continue losartan  > Toprol 25 started in setting of wall motion abnormalities in setting of recent MI    #Hx Gastric Ulcer  >Start Protonix 40 mg daily      Hypomagnesemia  Hypokalemia  Plan  > Repeat BMP and mag - replace to keep K > 4, Mg > 2     FEN:  > Diet: DIET NPO AT MIDNIGHT Sips With Medications  > IVF: None  > Monitor and replace electrolytes as needed     VTE ppx: SCDs, Heparin gtt  Code Status:  Full Code     DISPOSITION: Admit to Cardiology - Telemetry     Patient was discussed  with Dr. Staci Dykes MD  IM PGY1    _________________________________________________________________________  Subjective     NAEO. Patient reports SOB on exertion still, especially with ambulation. Denies any CP, edema or other symptoms at this time.     ROS:   Review of Systems   Constitutional:  Positive for malaise/fatigue. Negative for chills and fever.   HENT:  Negative for congestion.    Respiratory:  Positive for shortness of breath. Negative for cough.    Cardiovascular:  Negative for chest pain, palpitations and leg swelling.   Gastrointestinal:  Negative for nausea and vomiting.   Genitourinary:  Negative for dysuria and urgency.   Musculoskeletal:  Negative for back pain.   Neurological:  Positive for dizziness and weakness.        The patient  has a past medical history of Arrhythmia, Arthritis, Cancer (CMS-HCC), COPD (chronic obstructive pulmonary disease) (CMS-HCC), HTN (hypertension), Hyperlipemia, Obesity, Osteopenia, Palpitations, and Pre-diabetes.    The patient  has a past surgical history that includes appendectomy; Upper gastrointestinal endoscopy (N/A, 10/15/2018); Upper gastrointestinal endoscopy (N/A, 01/16/2019); knee surgery; joint replacement (Right, 11/2015); Cataract removal with implant (Bilateral); and sigmoidoscopy (N/A, 01/02/2023).    Social History     Tobacco Use    Smoking status: Never    Smokeless tobacco: Never   Substance Use Topics    Alcohol use: Yes     Comment: rarely    Drug use: No     family history includes COPD in her mother; Hypertension in her sister.    Allergies:  Patient has no active allergies.      Objective     Scheduled Meds:aspirin chewable tablet 81 mg, 81 mg, Oral, QDAY  brimonidine (ALPHAGAN) 0.2 % ophthalmic solution 1 drop, 1 drop, Both Eyes, Q8H  ezetimibe (ZETIA) tablet 10 mg, 10 mg, Oral, QDAY  hydroCHLOROthiazide (HYDRODIURIL) tablet 12.5 mg, 12.5 mg, Oral, QAM8  latanoprost (XALATAN) 0.005 % ophthalmic solution 1 drop, 1 drop, Both Eyes, QHS  losartan (COZAAR) tablet 50 mg, 50 mg, Oral, QDAY  magnesium sulfate   1 g/D5W 100 mL IVPB, 1 g, Intravenous, Q4H*   Followed by  magnesium sulfate   1 g/D5W 100 mL IVPB, 1 g, Intravenous, Q4H*  melatonin tablet 10 mg, 10 mg, Oral, QHS  pantoprazole DR (PROTONIX) tablet 40 mg, 40 mg, Oral, QDAY(21)  rosuvastatin (CRESTOR) tablet 20 mg, 20 mg, Oral, QDAY  ticagrelor (BRILINTA) tablet 90 mg, 90 mg, Oral, BID    Continuous Infusions:      PRN and Respiratory Meds:acetaminophen Q6H PRN, diphenhydrAMINE HCL Q4H PRN **OR** diphenhydrAMINE HCL Q4H PRN, ondansetron (ZOFRAN) IV Q6H PRN, patient's own medication Per Pharmacy                           Vital Signs: Last Filed                 Vital Signs: 24 Hour Range   BP: 141/59 (04/26 0413)  Temp: 36.7 ?C (98.1 ?F) (04/26 0413)  Pulse: 85 (04/26 0413)  Respirations: 18 PER MINUTE (04/26 0413)  SpO2: 100 % (04/26 0413)  O2 Device: Nasal cannula (04/26 0413)  O2 Liter Flow: 2 Lpm (04/26 0413) BP: (116-168)/(51-83)   Temp:  [36.3 ?C (97.4 ?F)-36.7 ?C (98.1 ?F)]   Pulse:  [58-104]   Respirations:  [14 PER MINUTE-22 PER MINUTE]   SpO2:  [96 %-100 %]   O2 Device: Nasal cannula  O2 Liter Flow: 2 Lpm   Intensity Pain Scale (Self Report): 2 (04/19/24 2109) Vitals:    04/18/24 0926 04/19/24 0453 04/20/24 0413   Weight: 73.5 kg (162 lb) 71.7 kg (158 lb) 72.5 kg (159 lb 12.8 oz)         Physical Exam:  Physical Exam  Vitals and nursing note reviewed.   Constitutional:       Appearance: Normal appearance.   HENT:      Head: Normocephalic and atraumatic.      Nose: Nose normal.      Mouth/Throat:      Mouth: Mucous membranes are moist.   Eyes:      Extraocular Movements: Extraocular movements intact.      Pupils: Pupils are equal, round, and reactive to light.   Cardiovascular:      Rate and Rhythm: Normal rate and regular rhythm.      Pulses: Normal pulses.   Pulmonary:      Breath sounds: Rhonchi and rales present.      Comments: Patient stopped to catch breath even with short sentences.  Abdominal:      Palpations: Abdomen is soft.   Musculoskeletal:      Right lower leg: No edema.      Left lower leg: No edema.   Skin:     General: Skin is warm.      Capillary Refill: Capillary refill takes less than 2 seconds.   Neurological:      General: No focal deficit present.      Mental Status: She is alert and oriented to person, place, and time.            Laboratory:  Recent Labs     04/17/24  1518 04/17/24  2332 04/18/24  0432 04/19/24  0336 04/20/24  0339   NA 135* 136*  --  137 136*   K 3.4* 4.3  --  3.7 3.9   CL 98 103  --  104 103   CO2 22 23  --  22 21   GAP 15* 10  --  11 12   BUN 14 12  --  11 12   CR 0.84 0.77  --  0.72 0.77   GLU 171* 89  --  87 90   CA 10.4 9.8  --  9.2 9.1   ALBUMIN 4.5  --   --   --   --    MG 1.5* 2.1  --  1.7 1.7   HGBA1C  --   --  5.7  --   --        Recent Labs     04/17/24  1518 04/17/24  2332 04/18/24  0432 04/18/24  0532 04/18/24  0859 04/18/24  2222 04/19/24  0336 04/19/24  0445 04/20/24  0339   WBC 8.20  --  9.80  --   --   --  9.60  --  10.10   HGB 13.5  --  12.6  --   --   --  12.5  --  12.1   HCT 39.2  --  36.3  --   --   --  36.2  --  35.3*   PLTCT 242  --  235  --   --   --  215  --  233   PTT  --  36.4  --  >200.0* 120.0* 72.1* --  58.8*  --    AST 26  --   --   --   --   --   --   --   --  ALT 15  --   --   --   --   --   --   --   --    ALKPHOS 37  --   --   --   --   --   --   --   --       Estimated Creatinine Clearance: 61.1 mL/min (based on SCr of 0.77 mg/dL).  Vitals:    04/18/24 0926 04/19/24 0453 04/20/24 0413   Weight: 73.5 kg (162 lb) 71.7 kg (158 lb) 72.5 kg (159 lb 12.8 oz)    No results for input(s): PHART, PO2ART in the last 72 hours.    Invalid input(s): PC02A      Pertinent radiology reviewed.    Malnutrition Details:                                        Active Wounds

## 2024-04-20 NOTE — Discharge Instructions - Pharmacy
 Discharge Summary      Name: Shari Spence  Medical Record Number: [id number]        Account Number:  [id number]  Date Of Birth:  1936-03-10                         Age:  88 y.o.  Admit date:  04/17/2024                     Discharge date: 04/20/2024      Discharge Attending:  Dr. Ok Berber  Discharge Summary Completed By: Elester Grim, MD    Service: Cardiology Service - [id number]    Reason for hospitalization:  NSTEMI (non-ST elevated myocardial infarction) (CMS-HCC) [I21.4]    Primary Discharge Diagnosis:   NSTEMI (non-ST elevated myocardial infarction) (CMS-HCC)    Hospital Diagnoses:  Hospital Problems        Active Problems    * (Principal) NSTEMI (non-ST elevated myocardial infarction) (CMS-HCC)    HTN (hypertension)    Nonrheumatic mitral valve regurgitation    Hyperlipemia     Present on Admission:   Nonrheumatic mitral valve regurgitation   HTN (hypertension)   Hyperlipemia        Significant Past Medical History        Arrhythmia      Comment:  past hx per patient.  Arthritis  Cancer (CMS-HCC)      Comment:  skin cancer  COPD (chronic obstructive pulmonary disease) (CMS-HCC)  HTN (hypertension)  Hyperlipemia  Obesity  Osteopenia  Palpitations  Pre-diabetes    Allergies   Patient has no active allergies.    Brief Hospital Course   The patient was admitted and the following issues were addressed during this hospitalization: (with pertinent details including admission exam/imaging/labs).  Shari Spence is an 88 year old female with past medical history of hypertension, hyperlipidemia, mitral valve insufficiency who presented to [facility] ED on 04/17/2024 for 3-day history of shortness of breath admitted for NSTEMI. Patient noted to also have new EKG changes and thus plan for [facility] 4/24 and Echo. 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 No stenting was preformed during initial cath and patient was evaluated by CTS for possible CABG. Patient was ultimately not the best candidate for CABG and plan for PCI of left main. Echocardiogram 4/24 EF is low with estimated EF of 30-35 %. There is significant wall motion abnormality with akinesia of the mid to distal segments of the LV, with hyperdynamic function of the basal segments.  S/p PCI on 4/25 w stent placed.  Will start on Birlinta + ASA + Protonix and add on Rosuvastatin 20 for high dose statin and dc pravastatin. Will switch HCTZ to Spironolactone 12.5. Continue Cozaar 50 mg daily and Toprol XL 25 mg daily in setting of ischemic cardiomyopathy. Unfortunately due to cost, will do 1 month of Brilinta and then switch to Plavix for remaining 11 months.  On initiation of Brilinta, pt had SOA which has been getting better over time. Explained to pt this is not an uncommon side effect and that it should improve w time.   Should follow up with Cardiology outpt. Recommend repeat echo in 1-2 months for MR and TR development w MitraClip consideration    Day of discharge exam notable for: Pt was feeling SOB but better than prior. Saturating well and likely due to [place]. Otherwise pt felt well with no chest pain  or any other physical exam findings.    Items Needing Follow Up   Pending items or areas that need to be addressed at follow up: NA    Pending Labs and Follow Up Radiology    Pending labs and/or radiology review at this time of discharge are listed below: Please note- any labs with collected status will not have a result; if this area is blank, there are no items for review.   Pending Labs       Order Current Status    PTT (APTT) Collected (04/18/24 1143)              Medications      Medication List      START taking these medications     aspirin 81 mg chewable tablet; Dose: 81 mg; Chew one tablet by mouth   daily. Indications: treatment to prevent a heart attack; For: treatment to   prevent a heart attack; Quantity: 90 tablet; Refills: 1; Start taking on:   April 21, 2024   metoprolol succinate XL 25 mg extended release tablet; Commonly known   as: TOPROL XL; Dose: 25 mg; Take one tablet by mouth daily. Indications:   myocardial reinfarction prevention; For: myocardial reinfarction   prevention; Quantity: 90 tablet; Refills: 1   pantoprazole DR 40 mg tablet; Commonly known as: PROTONIX; Dose: 40 mg;   Take one tablet by mouth daily. Indications: a stomach ulcer; For: a   stomach ulcer; Quantity: 90 tablet; Refills: 1   rosuvastatin 20 mg tablet; Commonly known as: CRESTOR; Dose: 20 mg; Take   one tablet by mouth daily. Indications: excessive fat in the blood; For:   excessive fat in the blood; Quantity: 90 tablet; Refills: 1   spironolactone 25 mg tablet; Commonly known as: ALDACTONE; Dose: 12.5   mg; Take one-half tablet by mouth daily. Take with food.  Indications:   heart failure with reduced ejection fraction; For: heart failure with   reduced ejection fraction; Quantity: 90 tablet; Refills: 1   ticagrelor 90 mg tablet; Commonly known as: BRILINTA; Dose: 90 mg; Take   one tablet by mouth twice daily. Indications: a sudden worsening of angina   called acute coronary syndrome; For: a sudden worsening of angina called   acute coronary syndrome; Quantity: 180 tablet; Refills: 1     CONTINUE taking these medications     acetaminophen SR 650 mg tablet; Commonly known as: TYLENOL ARTHRITIS   PAIN; Dose: 650 mg; Take one tablet by mouth every 6 hours as needed for   Pain.; Quantity: 180 tablet; Refills: 1   brimonidine 0.15 % ophthalmic solution; Commonly known as: ALPHAGAN P;   Dose: 1 drop; Refills: 0   CALCIUM 600 + D PO; Dose: 1 tablet; Refills: 0   CHOLEcalciferoL (vitamin D3) 50,000 units capsule; Commonly known as:   OPTIMAL D3; Dose: 50,000 Units; Refills: 0   CoQ10 (Ubiquinol) 200 mg Cap; Dose: 1 capsule; Refills: 0   cyanocobalamin (vitamin B-12) 5,000 mcg sublingual tablet; Dose: 1   tablet; Refills: 0   dorzolamide-timoloL 2-0.5 % ophthalmic solution; Commonly known as:   COSOPT; Refills: 0   ezetimibe 10 mg tablet; Commonly known as: ZETIA; Dose: 10 mg; Refills:   0   Folic Acid 800 mcg Tab; Dose: 1 tablet; Refills: 0   GLUCOSAMINE-CHONDROITIN 3X PO; Dose: 1 capsule; Refills: 0   latanoprost 0.005 % ophthalmic solution; Commonly known as: XALATAN;   Dose: 1 drop; Refills: 0   loperamide 2 mg tablet; Commonly  known as: IMODIUM; Dose: 1 mg; Refills:   0   losartan 50 mg tablet; Commonly known as: COZAAR; Dose: 50 mg; Refills:   0   melatonin 10 mg tablet; Dose: 1 tablet; Refills: 0   MetamuciL 3.4 gram/5.4 gram powder; Generic drug: psyllium husk; Dose:   3.4 g; Refills: 0   omega-3 fatty acids-vitamin E 1,000 mg Cap; Dose: 1 capsule; Refills: 0   other medication; Refills: 0   Potassium 99 mg Tab; Dose: 1 tablet; Refills: 0     STOP taking these medications     hydroCHLOROthiazide 12.5 mg tablet   pravastatin 10 mg tablet; Commonly known as: PRAVACHOL       Return Appointments and Scheduled Appointments     Scheduled appointments:      Apr 25, 2024 11:00 AM  Office visit with Perfecto Bracket, APRN-NP  Cardiovascular Medicine: [facility] ([facility] Exam) [address]  [address]  [place]  [place] [address]  [phone]     Nov 04, 2024 1:30 PM  Office visit with Davonna Estes, MD  Cardiovascular Medicine: [facility] (CV[facility] Exam) [address]  [address], [address]  [place]  [place] [address]  [phone]            Consults, Procedures, Diagnostics, Micro, Pathology   Consults: Cardiology  Surgical Procedures & Dates:  [facility] 4/24 and 4/25 w DES placed on 4/25  Significant Diagnostic Studies, Micro and Procedures: noted in brief hospital course  Significant Pathology: noted in brief hospital course                       Discharge Disposition, Condition   Patient Disposition: Home or Self Care [01]  Condition at Discharge: Stable    Code Status   Full Code    Patient Instructions     Activity       Activity as Tolerated   As directed      It is important to keep increasing your activity level after you leave the hospital.  Moving around can help prevent blood clots, lung infection (pneumonia) and other problems.  Gradually increasing the number of times you are up moving around will help you return to your normal activity level more quickly.  Continue to increase the number of times you are up to the chair and walking daily to return to your normal activity level. Begin to work towards your normal activity level at discharge.          Diet       Cardiac Diet   As directed      Limiting unhealthy fats and cholesterol is the most important step you can take in reducing your risk for cardiovascular disease.  Unhealthy fats include saturated and trans fats.  Monitor your sodium and cholesterol intake.  Restrict your sodium to 2g (grams) or 2000mg  (milligrams) daily, and your cholesterol to 200mg  daily.    If you have questions regarding your diet at home, you may contact a dietitian at [phone].             Discharge education provided to patient.    Additional Orders: Case Management, Supplies, Home Health     Home Health/DME       None              Signed:  Elester Grim, MD  04/20/2024      cc:  Primary Care Physician:  Susan Ensign   Verified    Referring physicians:  No ref. provider found   Additional provider(s):        Did we miss something? If additional records are needed, please fax a request on office letterhead to [phone]. Please include the patient's name, date of birth, fax number and type of information needed. Additional request can be made by email at [email or web address]. For general questions of information about electronic records sharing, call [phone].

## 2024-04-20 NOTE — Care Plan
 Problem: Discharge Planning  Goal: Participation in plan of care  Outcome: Goal Achieved  Flowsheets (Taken 04/20/2024 1504)  Participation in Plan of Care: Involve patient/caregiver in care planning decision making  Note: Dc to home  Goal: Knowledge regarding plan of care  Outcome: Goal Achieved  Flowsheets (Taken 04/20/2024 1504)  Knowledge regarding plan of care: Provide medication management education  Note: Discharged  Goal: Prepared for discharge  Outcome: Goal Achieved  Flowsheets (Taken 04/20/2024 1504)  Prepared for discharge:   Provide discharge activity restrictions education   Provide discharge materials appropriate to patient condition

## 2024-04-20 NOTE — Progress Notes
 HC5 END OF SHIFT/PLAN OF CARE NURSING NOTE   Nursing Shift: Day Shift 0700-1900    Acute events, nursing interventions, & communication with providers:  Pt discharge to home. Will continue on Brilinta (ticagrelor) for 1 month then switch to Plavix. Explained the first dose of Plavix will be a loading dose  and she will need to take 8 tablets. Then will continue the med every day, 1 pill. Will see provider on May 1. Pt stated she will keep her splint on her right wrist until Monday. Explained to notify provider if area of rt wrist becomes swollen or bruising is worse. Also If she has chest pain.      Patient Goal(s)  Patient will Verbalize readiness for discharge by the end of next shift.        Patient will  Verbalize readiness for discharge by discharge.   Admission Weight: Weight: 73.9 kg (163 lb)    Last 3 Weights:   Vitals:    04/18/24 0926 04/19/24 0453 04/20/24 0413   Weight: 73.5 kg (162 lb) 71.7 kg (158 lb) 72.5 kg (159 lb 12.8 oz)     Weight Change: Weight trend stable    Intake/Output Summary (Last 24 hours) at 04/20/2024 1459  Last data filed at 04/20/2024 1200  Gross per 24 hour   Intake 240 ml   Output 0 ml   Net 240 ml     Last Bowel Movement Date: 04/20/24    Fluid Restriction? No   Quality/Safety    Total Fall Risk Score: 9   Risk for Injury related to falls: Age > 85 years  Fall Risk Category:   History of More Than One Fall Within 6 Months Before Admission: No  Elimination, Bowel and Urine: N/A  Interventions: N/A - Does not score for risk in this category  Medications: On 2 or more high fall risk drugs  Interventions: Educate patient on medication side effects  Patient Care Equipment: One present  Interventions: Needs assistance with patient care equipment when ambulating  Mobility: 0 - No mobility issues  Interventions: N/A - Does not score as risk in this category  Cognition: 0 - No cognition issues  Interventions: N/A - Does not score as risk in this category    Other safety precautions in place: N/A    Restraints:  No      Patient Education  This RN provided education to Patient and Family today. The following education topics were reviewed:  Quality/Safety Education:   Fall risk  Medication Education:   Cardiac medication(s)  Education provided on the following medication(s):  brilinta, spriolactone, metoprolol  Cardiac - Specific Education:   Post cardiac procedure care: post cardiac cath care. [facility] [place]  General Education:   Discharge planning    The following teaching method(s) were used: Verbal and Handout  Response to learning: Verbalizes Understanding  Needs reinforcement on:  Stable at discharge

## 2024-04-20 NOTE — Progress Notes
 HC5 END OF SHIFT/PLAN OF CARE NURSING NOTE   Nursing Shift: Night Shift 1900-0700    Acute events, nursing interventions, & communication with providers: No acute events overnight. Pt endorsing SOA, placed on 2L nasal cannula for comfort. Magnesium and potassium replaced this morning.      Patient Goal(s)  Patient will Be able to ambulate without breathing difficulty by the end of next shift.        Patient will  Verbalize readiness for discharge by discharge.   Admission Weight: Weight: 73.9 kg (163 lb)    Last 3 Weights:   Vitals:    04/18/24 0926 04/19/24 0453 04/20/24 0413   Weight: 73.5 kg (162 lb) 71.7 kg (158 lb) 72.5 kg (159 lb 12.8 oz)     Weight Change: Weight trend stable    Intake/Output Summary (Last 24 hours) at 04/20/2024 1610  Last data filed at 04/20/2024 0413  Gross per 24 hour   Intake 290 ml   Output 0 ml   Net 290 ml     Last Bowel Movement Date:  (PTA)    Fluid Restriction? No   Quality/Safety    Total Fall Risk Score: 10   Risk for Injury related to falls: Coagulopathies/risk for bleed and Surgery/procedure within the last 24 hours  Fall Risk Category:   History of More Than One Fall Within 6 Months Before Admission: No  Elimination, Bowel and Urine: N/A  Interventions: N/A - Does not score for risk in this category  Medications: Sedated procedure within past 24 hours  Interventions: Educate patient on medication side effects  Patient Care Equipment: N/A  Interventions: N/A - Does not score as risk in this category  Mobility: 0 - No mobility issues  Interventions: N/A - Does not score as risk in this category  Cognition: 0 - No cognition issues  Interventions: N/A - Does not score as risk in this category    Other safety precautions in place: N/A    Restraints:  No      Patient Education  This RN provided education to Patient and Significant Other today. The following education topics were reviewed:  Quality/Safety Education:   Fall risk and Pain scale  Medication Education:   Cardiac medication(s) and Medication management (Indication, adverse effects, monitoring, etc)  Education provided on the following medication(s): zetia, protonix  Cardiac - Specific Education:   Heart failure management  General Education:   Bowel/Urinary elimination and Mobility/Activity intolerance    The following teaching method(s) were used: Verbal  Response to learning: [employer]

## 2024-04-25 ENCOUNTER — Encounter: Admit: 2024-04-25 | Discharge: 2024-04-25 | Payer: MEDICARE

## 2024-04-25 ENCOUNTER — Ambulatory Visit: Admit: 2024-04-25 | Discharge: 2024-04-26 | Payer: MEDICARE

## 2024-04-25 DIAGNOSIS — Z955 Presence of coronary angioplasty implant and graft: Secondary | ICD-10-CM

## 2024-04-25 DIAGNOSIS — I340 Nonrheumatic mitral (valve) insufficiency: Secondary | ICD-10-CM

## 2024-04-25 DIAGNOSIS — I10 Essential (primary) hypertension: Secondary | ICD-10-CM

## 2024-04-25 DIAGNOSIS — E782 Mixed hyperlipidemia: Secondary | ICD-10-CM

## 2024-04-25 MED ORDER — METOPROLOL SUCCINATE 25 MG PO TB24
12.5 mg | ORAL_TABLET | Freq: Every day | ORAL | 1 refills | 90.00000 days | Status: DC
Start: 2024-04-25 — End: 2024-04-25

## 2024-04-25 MED ORDER — METOPROLOL SUCCINATE 25 MG PO TB24
12.5 mg | Freq: Every day | ORAL | 0 refills | 90.00000 days | Status: AC
Start: 2024-04-25 — End: ?

## 2024-04-25 NOTE — Patient Instructions
-  Thank you for allowing us  to participate in your care today. Your After Visit Summary is being completed by Geovannie Vilar, RN.    -We would like you to follow up in 1 month_  with Lenette [facility]_  We will schedule today.    -Please schedule the following testing at check out, or by calling our scheduling line:        Changes From Today's Office Visit    *    Contacting our office:    -For NON-URGENT questions please contact us  via message through your MyChart account.  -For all medication refills please contact your pharmacy or send a request through MyChart.    -For all questions that may need to be addressed urgently please call the [facility] nursing triage line at [phone] Monday - Friday 8am-5pm only. Please leave a detailed message with your name, date of birth, and reason for your call.  If your message is received before 3:30pm, every effort will be made to call you back the same day.  Please allow time for us  to review your chart prior to call back.    -Should you have an urgent concern over the weekend/nights, the on-call triage line is [phone].    -[facility] nursing team fax number: [phone]    -You may receive a survey in the upcoming weeks from [facility]. Your feedback is important to us  and helps us  continue to improve patient care and patient satisfaction.    -Please feel free to call our Financial Department at [phone] with any questions or concerns about estimated cost of testing or imaging ordered today. We are happy to provide CPT codes upon request.    Results & Testing Follow Up:    -Please allow 5-7 business days for the results of any testing to be reviewed. Please call our office if you have not heard from a nurse within this time frame.    -Should you choose to complete testing at an outside facility, please contact our office after completion of testing so that we can ensure that we have received results for your provider to review.    Lab and test results:  As a part of the CARES act, starting 03/26/2020, some results will be released to you via MyChart immediately and automatically.  You may see results before your provider sees them; however, your provider will review all these results and then they, or one of their team, will notify you of result information and recommendations.   Critical results will be addressed immediately, but otherwise, please allow us  time to get back with you prior to you reaching out to us  for questions.  This will usually take about 72 hours for labs and 5-7 days for procedure test results.

## 2024-04-26 DIAGNOSIS — I214 Non-ST elevation (NSTEMI) myocardial infarction: Secondary | ICD-10-CM

## 2024-05-01 ENCOUNTER — Encounter: Admit: 2024-05-01 | Discharge: 2024-05-01 | Payer: MEDICARE

## 2024-05-01 MED ORDER — METOPROLOL SUCCINATE 25 MG PO TB24
12.5 mg | ORAL_TABLET | Freq: Every day | ORAL | 0 refills | 90.00000 days | Status: AC
Start: 2024-05-01 — End: ?

## 2024-05-01 NOTE — Telephone Encounter
 [facility] pharmacy called and LVM to clarify quantity on Rx sent in on 04/25/24.     Clarified on 5/1 with RN in clinic with Minor Amble that this was supposed to be a no print Rx as patient had plenty of supply on hand at home. RX updated to no print.     New RX submitted.

## 2024-05-31 ENCOUNTER — Encounter: Admit: 2024-05-31 | Discharge: 2024-05-31 | Payer: MEDICARE

## 2024-06-03 ENCOUNTER — Ambulatory Visit: Admit: 2024-06-03 | Discharge: 2024-06-03 | Payer: MEDICARE

## 2024-06-03 ENCOUNTER — Encounter: Admit: 2024-06-03 | Discharge: 2024-06-03 | Payer: MEDICARE

## 2024-06-04 ENCOUNTER — Encounter: Admit: 2024-06-04 | Discharge: 2024-06-04 | Payer: MEDICARE

## 2024-06-04 NOTE — Telephone Encounter
-----   Message from Perfecto Bracket, APRN-NP sent at 06/03/2024  5:21 PM CDT -----  Can we call and ask her how she's doing? I was supposed to see her today and unfortunately had to reschedule. She was severely short of breath when I Saw her last but her echo looks improved over our   last study.     Thank you,   Lenette Quick   ----- Message -----  From: Karla Oven, MD  Sent: 06/03/2024   3:28 PM CDT  To: Perfecto Bracket, APRN-NP

## 2024-06-04 NOTE — Telephone Encounter
 Call to patient to check and see how she is doing. She reports overall she is feeling better from the shortness of breath standpoint.  She has noticed that with walking up any incline she has a little bit of SOB but is able to stop and rest for a few seconds and is she is okay.     She asked if she was able to go out and ride her lawn mower and advised this would be okay to do.      She will call if she has any worsening symptoms before she sees Lenette Quick for follow up    [place] RN

## 2024-06-19 ENCOUNTER — Encounter: Admit: 2024-06-19 | Discharge: 2024-06-19 | Payer: MEDICARE

## 2024-06-20 ENCOUNTER — Ambulatory Visit: Admit: 2024-06-20 | Discharge: 2024-06-20 | Payer: MEDICARE

## 2024-06-20 ENCOUNTER — Encounter: Admit: 2024-06-20 | Discharge: 2024-06-20 | Payer: MEDICARE

## 2024-06-20 NOTE — Progress Notes
 Consultation Report    Shari Spence  Admission Date: 06/19/2024                     Assessment  1) symptomatic hypertensive urgency.  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  The object should be to keep her from having very elevated blood pressure readings which cause symptoms while avoiding low blood pressure readings that could cause orthostasis and falls.  Several options were reviewed with Shari Spence.  She prefers to try to use amlodipine 2.5 mg if her systolic blood pressure exceeds 140-150 mmHg.  If her systolic blood pressure is near normal in the morning and she does not take her amlodipine in the morning but increases above 150 mmHg later in the day, she could take the amlodipine at that time. 2) most likely her labile hypertension is still due to essential hypertension.  If she continues to have markedly elevated blood pressure readings then evaluation for secondary causes of hypertension can be pursued.  I believe that she has already had a CT abdomen to evaluate for renal artery stenosis as a cause for hypertensive urgency.  If she continues to have spikes in her blood pressure in the future, then evaluation for other secondary causes of hypertension such as pheochromocytoma can be pursued. 3) close cardiovascular follow-up is recommended. The total time spent during this interview and exam with preparation and chart review was 60 minutes.  __________________________________________________________________________________    Chief Complaint: Dyspnea associated with hypertension  History of Present Illness: Shari Spence is a 89 y.o. female who developed dyspnea while participating in cardiac rehab during the morning of 06/19/2024.  She indicated that she was walking on the treadmill at 2.9 mph with a 1.5% incline when she developed dyspnea, fatigue and hypertension.  She had performed this activity in cardiac rehab on 06/17/2024 without difficulty.  She remained hypertensive and was hospitalized.  Her blood pressure was brought under control with hydralazine, clonidine and Ativan and has her her blood pressure lowered she felt much better.  In the past she has had chest pressure associated with an ischemic presentation prior to coronary stenting and she reports no chest pressure yesterday whatsoever.  She may have felt somewhat lightheaded when her blood pressure was elevated on 06/19/2024 but was not presyncopal or syncopal.  She reported no palpitations.    Her past medical history is noteworthy for coronary artery disease.  On 04/19/2024 she underwent successful percutaneous coronary invention of the ostial left main coronary artery utilizing a 4.0 x 18 mm drug-eluting Xience stent after presenting with a non-ST elevation myocardial infarction.    Past Medical History:    Arrhythmia    Arthritis    Cancer (CMS-HCC)    COPD (chronic obstructive pulmonary disease) (CMS-HCC)    HTN (hypertension)    Hyperlipemia    Obesity    Osteopenia    Palpitations    Pre-diabetes     Surgical History:   Procedure Laterality Date    HX JOINT REPLACEMENT Right 11/2015    right hip     ESOPHAGOGASTRODUODENOSCOPY WITH BIOPSY - FLEXIBLE N/A 10/15/2018    Performed by Priscella Squires, MD at [facility] ENDO    ESOPHAGOGASTRODUODENOSCOPY WITH BIOPSY - FLEXIBLE N/A 01/16/2019    Performed by Bonnetta Locus, MD at [facility] ENDO    Left forearm dorsal mass excision Left 10/16/2020    Performed by Carlean Lang ORN,  MD at [facility] OR    ANORECTAL MANOMETRY N/A 01/13/2022    Performed by Ledora Catalina, MD at [facility] ENDO    SIGMOIDOSCOPY WITH ENDOSCOPIC ULTRASOUND EXAMINATION - FLEXIBLE N/A 01/02/2023    Performed by Rogers Kipper, MD at [facility] OR    ANGIOGRAPHY CORONARY ARTERY WITH LEFT HEART CATHETERIZATION N/A 04/18/2024    Performed by Hajj, Inetta SQUIBB, MD at [facility] CATH LAB    PERCUTANEOUS CORONARY STENT PLACEMENT WITH ANGIOPLASTY N/A 04/18/2024    Performed by Hajj, Inetta SQUIBB, MD at [facility] CATH LAB    PERCUTANEOUS CORONARY STENT PLACEMENT WITH ANGIOPLASTY N/A 04/19/2024    Performed by Lannis Maude SAILOR, MD at [facility] EP LAB    CATARACT REMOVAL WITH IMPLANT Bilateral     HX APPENDECTOMY      KNEE SURGERY       Family History   Problem Relation Name Age of Onset    COPD Mother      Hypertension Sister       Social History     Socioeconomic History    Marital status: Married     Spouse name: Shari Spence    Number of children: 4    Years of education: 12   Tobacco Use    Smoking status: Never    Smokeless tobacco: Never   Substance and Sexual Activity    Alcohol use: Yes     Comment: rarely    Drug use: No    Sexual activity: Not Currently        Allergies:  Patient has no active allergies.    Medications:   acetaminophen  SR (TYLENOL  ARTHRITIS PAIN) 650 mg tablet Take one tablet by mouth every 6 hours as needed for Pain.    aspirin  81 mg chewable tablet Chew one tablet by mouth daily. Indications: treatment to prevent a heart attack    brimonidine  (ALPHAGAN  P) 0.15 % ophthalmic solution Apply one drop to both eyes three times daily.    calcium carbonate/vitamin D3 (CALCIUM 600 + D PO) Take 1 tablet by mouth twice daily.    CHOLEcalciferoL (vitamin D3) (OPTIMAL D3) 50,000 units capsule Take one capsule by mouth every 7 days.    [START ON 05/20/2024] clopiDOGreL  (PLAVIX ) 75 mg tablet On day 1 of clopidogrel  (at least 12 hours from last Brilinta  dose), take eight tablets once daily THEN on day 2 and thereafter, take one tablet by mouth daily. To be taken after 1 month use of Brilinta . Brilinta  for 30 days then 11 months of clopidogrel /plavix .  Indications: treatment to prevent a heart attack (Patient not taking: Reported on 04/25/2024)    CoQ10 (Ubiquinol) 200 mg cap Take one capsule by mouth daily.    cyanocobalamin (vitamin B-12) 5,000 mcg subl Place one tablet under tongue daily.    dorzolamide -timoloL  (COSOPT) 2-0.5 % ophthalmic solution INSTILL 1 DROP INTO EACH EYE TWICE DAILY ezetimibe  (ZETIA ) 10 mg tablet Take one tablet by mouth daily.    Folic Acid 800 mcg tab Take one tablet by mouth daily.    glucosam/chon-msm1/C/mang/bosw (GLUCOSAMINE-CHONDROITIN 3X PO) Take 1 capsule by mouth daily.    latanoprost  (XALATAN ) 0.005 % ophthalmic solution Apply one drop to both eyes at bedtime daily.    loperamide (IMODIUM) 2 mg tablet Take one-half tablet by mouth every 48 hours.    losartan  (COZAAR ) 50 mg tablet Take one tablet by mouth daily.    melatonin 10 mg tab Take one tablet by mouth at bedtime daily.    metoprolol   succinate XL (TOPROL  XL) 25 mg extended release tablet Take one-half tablet by mouth daily. Indications: myocardial reinfarction prevention    omega-3 fatty acids-vitamin E 1,000 mg cap Take one capsule by mouth daily.    other medication Mary Ruth's Liquid Multivitamin + Hair Growth formula: take 30 mL by mouth once daily    pantoprazole  DR (PROTONIX ) 40 mg tablet Take one tablet by mouth daily. Indications: a stomach ulcer    Potassium 99 mg tab Take one tablet by mouth daily.    psyllium husk (METAMUCIL) 3.4 gram/5.4 gram powder Take 3.4 g by mouth daily.    rosuvastatin  (CRESTOR ) 20 mg tablet Take one tablet by mouth daily. Indications: excessive fat in the blood    spironolactone  (ALDACTONE ) 25 mg tablet Take one-half tablet by mouth daily. Take with food.  Indications: heart failure with reduced ejection fraction    ticagrelor  (BRILINTA ) 90 mg tablet Take one tablet by mouth twice daily. Indications: a sudden worsening of angina called acute coronary syndrome       Review of Systems:  Gen: Normal   HEENT: Normal   Resp: Normal  CV: Normal  Abd: Normal  GU: Normal  Musculoskeletal: Normal  Heme: Normal   Endo: Normal  Derm: Normal  Psych: Normal   Neuro: Normal  Extremities: Normal    Physical Exam:  Vital Signs: Last Filed In 24 Hours Vital Signs: 24 Hour Range   Vital signs at 7:40 AM on 06/20/2024 revealed temperature = 97.0, pulse = 63 bpm, blood pressure = 125/54 mmHg, respiratory rate = 14/min and O2 sat = 97%.        Further review of all of her vital signs since admission reveals that her blood pressure has been fairly labile during this hospitalization. initially she was hypertensive and her blood pressure decreased after she received hydralazine, clonidine and Ativan.    GENERAL: The patient is well developed, well nourished, resting comfortably and in no distress.   HEENT: No abnormalities of the visible oro-nasopharynx, conjunctiva or sclera are noted.  NECK: There is no jugular venous distension. Carotids are palpable and without bruits. There is no thyroid enlargement.  Chest: Lung fields are clear to auscultation. There are no wheezes or crackles.  CV: There is a regular rhythm. The first and second heart sounds are normal. There are no murmurs, gallops or rubs.  ABD: The abdomen is soft and supple with normal bowel sounds. There is no hepatosplenomegaly, ascites, tenderness, masses or bruits.  Neuro: There are no focal motor defects. Ambulation is normal according to the patient. Cognitive function appears normal.  Ext: There is no edema or evidence of deep vein thrombosis. Peripheral pulses are satisfactory.    SKIN: There are no rashes and no cellulitis  PSYCH: The patient is calm, rationale and oriented.    Lab/Radiology/Other Diagnostic Tests:  [facility] labs: 06/19/2024, hemoglobin = 12.6 g/dL, serum creatinine = 9.05 mg/dL potassium = 4.4 mmol/L, troponin I less than 0.01    CBC w/Diff    Lab Results   Component Value Date/Time    WBC 10.10 04/20/2024 03:39 AM    RBC 3.87 (L) 04/20/2024 03:39 AM    HGB 12.1 04/20/2024 03:39 AM    HCT 35.3 (L) 04/20/2024 03:39 AM    MCV 91.2 04/20/2024 03:39 AM    MCH 31.2 04/20/2024 03:39 AM    MCHC 34.2 04/20/2024 03:39 AM    RDW 14.0 04/20/2024 03:39 AM    PLTCT 233 04/20/2024 03:39 AM  MPV 8.4 04/20/2024 03:39 AM    Lab Results   Component Value Date/Time    NEUT 76.1 04/17/2024 03:18 PM    ANC 6.30 04/17/2024 03:18 PM LYMA 18.2 (L) 04/17/2024 03:18 PM    ALC 1.50 04/17/2024 03:18 PM    MONA 4.7 04/17/2024 03:18 PM    AMC 0.40 04/17/2024 03:18 PM    EOSA 0.7 04/17/2024 03:18 PM    AEC 0.10 04/17/2024 03:18 PM    BASA 0.3 04/17/2024 03:18 PM    ABC 0.00 04/17/2024 03:18 PM       Comprehensive Metabolic Profile    Lab Results   Component Value Date/Time    NA 136 (L) 04/20/2024 03:39 AM    K 3.9 04/20/2024 03:39 AM    CL 103 04/20/2024 03:39 AM    CO2 21 04/20/2024 03:39 AM    GAP 12 04/20/2024 03:39 AM    BUN 12 04/20/2024 03:39 AM    CR 0.77 04/20/2024 03:39 AM    GLU 90 04/20/2024 03:39 AM    Lab Results   Component Value Date/Time    CA 9.1 04/20/2024 03:39 AM    ALBUMIN 4.5 04/17/2024 03:18 PM    TOTPROT 7.9 04/17/2024 03:18 PM    ALKPHOS 37 04/17/2024 03:18 PM    AST 26 04/17/2024 03:18 PM    ALT 15 04/17/2024 03:18 PM    TOTBILI 0.6 04/17/2024 03:18 PM    GFR >60 04/20/2024 03:39 AM    GFRAA >60 09/09/2020 11:04 PM                  A twelve-lead ECG obtained at 1:17 PM on 06/19/2024 reveals normal sinus rhythm with a heart rate of 66 bpm.  There is no evidence of myocardial ischemia or infarction.    An echo Doppler study performed on 06/03/2024 revealed:  Interpretation Summary  Left ventricular chamber is volumetrically dilated with LVEDVi of 73 mL/m? with concentric remodeling.  Normal global and regional LV systolic function.  At least mild diastolic dysfunction.  LVEF=60%.  Mildly dilated right ventricle with preserved contractility.    Normal sized atria.  Normal central venous pressure (0-5 mmHg).  Estimated pulmonary artery systolic pressure is 39 mmHg.  Mitral annular calcification and leaflet thickening without stenosis.  Mild to moderate MR.  Sclerotic aortic valve with focal thickening without stenosis.  Mild AI.  Aortic root and proximal ascending aorta are normal in size.  No pericardial effusion.     Compared with prior study dated 04/18/2024, there is interval improvement in global LV systolic function and resolution of the apical akinesis.  Previously reported LVEF was 45%.  There is interval improvement in severity of the mitral and tricuspid regurgitation which was moderate to severe in the prior study.  Elspeth KATHEE Alexander, MD

## 2024-06-20 NOTE — Telephone Encounter
 Received VM from pts husband, Bill. Re: high blood pressure at cardiac rehab yesterday and went to [facility].    [place] and spoke to [place].  Bill states that pts blood pressure at cardiac rehab yesterday was 240s, so she rested until it came down and RN stopped session short.  Zell states that he brought pt to [facility] hospital afterward cause BP was still in 200s and they are currently still there being treated and have been admitted.       Received cardiac rehab records and will scan to chart.    Moved appt up with Dr. Grayson to next week.

## 2024-06-21 ENCOUNTER — Encounter: Admit: 2024-06-21 | Discharge: 2024-06-21 | Payer: MEDICARE

## 2024-06-22 ENCOUNTER — Emergency Department: Admit: 2024-06-22 | Discharge: 2024-06-22 | Payer: MEDICARE

## 2024-06-22 ENCOUNTER — Encounter: Admit: 2024-06-22 | Discharge: 2024-06-22 | Payer: MEDICARE

## 2024-06-22 ENCOUNTER — Observation Stay: Admit: 2024-06-22 | Discharge: 2024-06-23 | Payer: MEDICARE

## 2024-06-27 ENCOUNTER — Encounter: Admit: 2024-06-27 | Discharge: 2024-06-27 | Payer: MEDICARE

## 2024-06-27 ENCOUNTER — Ambulatory Visit: Admit: 2024-06-27 | Discharge: 2024-06-28 | Payer: MEDICARE

## 2024-06-27 ENCOUNTER — Ambulatory Visit: Admit: 2024-06-27 | Discharge: 2024-06-27 | Payer: MEDICARE

## 2024-06-27 NOTE — Telephone Encounter
 Dr. Grayson reviewed CT head. Per Dr. Grayson, no hemorrhage or mass, looks ok. No changes to treatment plan.     Spoke to pt. Patient verbalized understanding and does not have any further questions or concerns.  Patient has our contact information for future needs

## 2024-07-18 ENCOUNTER — Encounter: Admit: 2024-07-18 | Discharge: 2024-07-18 | Payer: MEDICARE

## 2024-08-20 ENCOUNTER — Encounter: Admit: 2024-08-20 | Discharge: 2024-08-20 | Payer: MEDICARE

## 2024-08-20 ENCOUNTER — Ambulatory Visit: Admit: 2024-08-20 | Discharge: 2024-08-21 | Payer: MEDICARE

## 2024-08-27 ENCOUNTER — Encounter: Admit: 2024-08-27 | Discharge: 2024-08-27 | Payer: MEDICARE

## 2024-10-28 ENCOUNTER — Encounter: Admit: 2024-10-28 | Discharge: 2024-10-28 | Payer: MEDICARE

## 2024-10-28 MED ORDER — CLOPIDOGREL 75 MG PO TAB
ORAL_TABLET | ORAL | 1 refills | 90.00000 days | Status: AC
Start: 2024-10-28 — End: ?

## 2024-12-25 ENCOUNTER — Encounter: Admit: 2024-12-25 | Discharge: 2024-12-25 | Payer: MEDICARE

## 2024-12-31 ENCOUNTER — Encounter: Admit: 2024-12-31 | Discharge: 2024-12-31 | Payer: MEDICARE

## 2024-12-31 MED ORDER — SPIRONOLACTONE 25 MG PO TAB
25 mg | ORAL_TABLET | Freq: Every day | ORAL | 1 refills | 90.00000 days | Status: AC
Start: 2024-12-31 — End: ?
